# Patient Record
Sex: Male | Born: 1995 | Race: White | Hispanic: No | Marital: Married
Health system: Southern US, Community
[De-identification: ages and names within clinical notes are randomized; demographics above are authoritative.]

## PROBLEM LIST (undated history)

## (undated) ENCOUNTER — Emergency Department (HOSPITAL_COMMUNITY): Payer: Self-pay

---
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
## 2019-09-14 ENCOUNTER — Inpatient Hospital Stay (HOSPITAL_COMMUNITY): Payer: No Typology Code available for payment source

## 2019-09-14 ENCOUNTER — Encounter (HOSPITAL_COMMUNITY): Admission: EM | Disposition: A | Discharge status: Home Health | Payer: Self-pay | Source: Home / Self Care

## 2019-09-14 ENCOUNTER — Encounter (HOSPITAL_COMMUNITY): Payer: Self-pay | Admitting: Emergency Medicine

## 2019-09-14 ENCOUNTER — Emergency Department (HOSPITAL_COMMUNITY): Payer: No Typology Code available for payment source

## 2019-09-14 ENCOUNTER — Inpatient Hospital Stay (HOSPITAL_COMMUNITY)
Admission: EM | Admit: 2019-09-14 | Discharge: 2019-09-24 | DRG: 492 | Disposition: A | Discharge status: Home Health | Payer: No Typology Code available for payment source | Attending: Surgery | Admitting: Surgery

## 2019-09-14 ENCOUNTER — Other Ambulatory Visit: Payer: Self-pay

## 2019-09-14 ENCOUNTER — Inpatient Hospital Stay (HOSPITAL_COMMUNITY): Payer: No Typology Code available for payment source | Admitting: Certified Registered"

## 2019-09-14 DIAGNOSIS — Z539 Procedure and treatment not carried out, unspecified reason: Secondary | ICD-10-CM

## 2019-09-14 DIAGNOSIS — S60512A Abrasion of left hand, initial encounter: Secondary | ICD-10-CM | POA: Diagnosis present

## 2019-09-14 DIAGNOSIS — Z720 Tobacco use: Secondary | ICD-10-CM

## 2019-09-14 DIAGNOSIS — Z20828 Contact with and (suspected) exposure to other viral communicable diseases: Secondary | ICD-10-CM | POA: Diagnosis present

## 2019-09-14 DIAGNOSIS — S82842A Displaced bimalleolar fracture of left lower leg, initial encounter for closed fracture: Principal | ICD-10-CM

## 2019-09-14 DIAGNOSIS — T148XXA Other injury of unspecified body region, initial encounter: Secondary | ICD-10-CM

## 2019-09-14 DIAGNOSIS — Z9889 Other specified postprocedural states: Secondary | ICD-10-CM

## 2019-09-14 DIAGNOSIS — S80212A Abrasion, left knee, initial encounter: Secondary | ICD-10-CM | POA: Diagnosis present

## 2019-09-14 DIAGNOSIS — F1113 Opioid abuse with withdrawal: Secondary | ICD-10-CM | POA: Diagnosis not present

## 2019-09-14 DIAGNOSIS — S80211A Abrasion, right knee, initial encounter: Secondary | ICD-10-CM | POA: Diagnosis present

## 2019-09-14 DIAGNOSIS — M25572 Pain in left ankle and joints of left foot: Secondary | ICD-10-CM

## 2019-09-14 DIAGNOSIS — K0889 Other specified disorders of teeth and supporting structures: Secondary | ICD-10-CM | POA: Diagnosis present

## 2019-09-14 DIAGNOSIS — Y92410 Unspecified street and highway as the place of occurrence of the external cause: Secondary | ICD-10-CM | POA: Diagnosis not present

## 2019-09-14 DIAGNOSIS — S50312A Abrasion of left elbow, initial encounter: Secondary | ICD-10-CM | POA: Diagnosis present

## 2019-09-14 DIAGNOSIS — S32421A Displaced fracture of posterior wall of right acetabulum, initial encounter for closed fracture: Secondary | ICD-10-CM

## 2019-09-14 DIAGNOSIS — S32409A Unspecified fracture of unspecified acetabulum, initial encounter for closed fracture: Secondary | ICD-10-CM

## 2019-09-14 DIAGNOSIS — Z885 Allergy status to narcotic agent status: Secondary | ICD-10-CM

## 2019-09-14 DIAGNOSIS — Z419 Encounter for procedure for purposes other than remedying health state, unspecified: Secondary | ICD-10-CM

## 2019-09-14 DIAGNOSIS — S60511A Abrasion of right hand, initial encounter: Secondary | ICD-10-CM | POA: Diagnosis present

## 2019-09-14 DIAGNOSIS — D62 Acute posthemorrhagic anemia: Secondary | ICD-10-CM | POA: Diagnosis not present

## 2019-09-14 DIAGNOSIS — S73014A Posterior dislocation of right hip, initial encounter: Secondary | ICD-10-CM

## 2019-09-14 DIAGNOSIS — M79604 Pain in right leg: Secondary | ICD-10-CM | POA: Diagnosis present

## 2019-09-14 DIAGNOSIS — S2242XA Multiple fractures of ribs, left side, initial encounter for closed fracture: Secondary | ICD-10-CM

## 2019-09-14 DIAGNOSIS — S80812A Abrasion, left lower leg, initial encounter: Secondary | ICD-10-CM | POA: Diagnosis present

## 2019-09-14 DIAGNOSIS — S73004A Unspecified dislocation of right hip, initial encounter: Secondary | ICD-10-CM

## 2019-09-14 DIAGNOSIS — Z23 Encounter for immunization: Secondary | ICD-10-CM

## 2019-09-14 DIAGNOSIS — F1110 Opioid abuse, uncomplicated: Secondary | ICD-10-CM

## 2019-09-14 DIAGNOSIS — S32461A Displaced associated transverse-posterior fracture of right acetabulum, initial encounter for closed fracture: Secondary | ICD-10-CM | POA: Diagnosis present

## 2019-09-14 DIAGNOSIS — R52 Pain, unspecified: Secondary | ICD-10-CM

## 2019-09-14 HISTORY — PX: INSERTION OF TRACTION PIN: SHX6560

## 2019-09-14 HISTORY — PX: HIP CLOSED REDUCTION: SHX983

## 2019-09-14 LAB — COMPREHENSIVE METABOLIC PANEL
ALT: 60 U/L — ABNORMAL HIGH (ref 0–44)
AST: 87 U/L — ABNORMAL HIGH (ref 15–41)
Albumin: 3.4 g/dL — ABNORMAL LOW (ref 3.5–5.0)
Alkaline Phosphatase: 63 U/L (ref 38–126)
Anion gap: 13 (ref 5–15)
BUN: 13 mg/dL (ref 6–20)
BUN: 14 mg/dL (ref 6–20)
CO2: 22 mmol/L (ref 22–32)
Calcium: 9.1 mg/dL (ref 8.9–10.3)
Chloride: 103 mmol/L (ref 98–111)
Chloride: 102 mmol/L (ref 98–111)
Creatinine, Ser: 0.93 mg/dL (ref 0.61–1.24)
Creatinine, Ser: 0.8 mg/dL (ref 0.61–1.24)
GFR calc Af Amer: >60 mL/min (ref >60)
GFR calc non Af Amer: >60 mL/min (ref >60)
Glucose, Bld: 161 mg/dL — ABNORMAL HIGH (ref 70–99)
Glucose, Bld: 159 mg/dL — ABNORMAL HIGH (ref 70–99)
Potassium: 3.8 mmol/L (ref 3.5–5.1)
Potassium: 3.7 mmol/L (ref 3.5–5.1)
Sodium: 138 mmol/L (ref 135–145)
Sodium: 139 mmol/L (ref 135–145)
Total Bilirubin: 0.7 mg/dL (ref 0.3–1.2)
Total Protein: 7.4 g/dL (ref 6.5–8.1)

## 2019-09-14 LAB — ETHANOL: Alcohol, Ethyl (B): <10 mg/dL (ref <10)

## 2019-09-14 LAB — URINALYSIS, ROUTINE W REFLEX MICROSCOPIC
Bilirubin Urine: NEGATIVE
Glucose, UA: NEGATIVE mg/dL
Hgb urine dipstick: NEGATIVE
Ketones, ur: NEGATIVE mg/dL
Leukocytes,Ua: NEGATIVE
Nitrite: NEGATIVE
Protein, ur: NEGATIVE mg/dL
Specific Gravity, Urine: 1.026 (ref 1.005–1.030)
pH: 7 (ref 5.0–8.0)

## 2019-09-14 LAB — CBC
HCT: 41.7 % (ref 39.0–52.0)
Hemoglobin: 13.4 g/dL (ref 13.0–17.0)
MCH: 28 pg (ref 26.0–34.0)
MCHC: 32.1 g/dL (ref 30.0–36.0)
MCV: 87.1 fL (ref 80.0–100.0)
Platelets: 360 10*3/uL (ref 150–400)
RBC: 4.79 MIL/uL (ref 4.22–5.81)
RDW: 13.9 % (ref 11.5–15.5)
WBC: 18.7 10*3/uL — ABNORMAL HIGH (ref 4.0–10.5)
nRBC: 0 % (ref 0.0–0.2)

## 2019-09-14 LAB — SARS CORONAVIRUS 2 BY RT PCR (HOSPITAL ORDER, PERFORMED IN ~~LOC~~ HOSPITAL LAB): SARS Coronavirus 2: NEGATIVE

## 2019-09-14 LAB — RAPID URINE DRUG SCREEN, HOSP PERFORMED
Amphetamines: NOT DETECTED
Barbiturates: NOT DETECTED
Benzodiazepines: POSITIVE — AB
Cocaine: NOT DETECTED
Opiates: POSITIVE — AB
Tetrahydrocannabinol: NOT DETECTED

## 2019-09-14 LAB — CDS SEROLOGY

## 2019-09-14 LAB — PROTIME-INR
INR: 0.9 (ref 0.8–1.2)
Prothrombin Time: 12.4 seconds (ref 11.4–15.2)

## 2019-09-14 LAB — SAMPLE TO BLOOD BANK

## 2019-09-14 LAB — HIV ANTIBODY (ROUTINE TESTING W REFLEX): HIV Screen 4th Generation wRfx: NONREACTIVE

## 2019-09-14 LAB — LACTIC ACID, PLASMA: Lactic Acid, Venous: 2.3 mmol/L (ref 0.5–1.9)

## 2019-09-14 SURGERY — CLOSED REDUCTION, HIP
Anesthesia: General | Site: Leg Upper | Laterality: Right

## 2019-09-14 MED ORDER — METHOCARBAMOL 500 MG PO TABS
500.0000 mg | ORAL_TABLET | Freq: Four times a day (QID) | ORAL | Status: DC | PRN
  Administered 2019-09-14 – 2019-09-18 (×9): 500 mg via ORAL
  Filled 2019-09-14 (×9): qty 1

## 2019-09-14 MED ORDER — ACETAMINOPHEN 10 MG/ML IV SOLN
INTRAVENOUS | Status: AC
  Filled 2019-09-14: qty 100

## 2019-09-14 MED ORDER — ACETAMINOPHEN 325 MG PO TABS: 650.0000 mg | ORAL_TABLET | ORAL | Status: DC | PRN

## 2019-09-14 MED ORDER — LIDOCAINE 2% (20 MG/ML) 5 ML SYRINGE
INTRAMUSCULAR | Status: DC | PRN
  Administered 2019-09-14: 60 mg via INTRAVENOUS

## 2019-09-14 MED ORDER — CEFAZOLIN SODIUM-DEXTROSE 2-4 GM/100ML-% IV SOLN
2.0000 g | INTRAVENOUS | Status: AC
  Administered 2019-09-14: 13:00:00 2 g via INTRAVENOUS

## 2019-09-14 MED ORDER — POVIDONE-IODINE 10 % EX SWAB: 2.0000 "application " | Freq: Once | CUTANEOUS | Status: DC

## 2019-09-14 MED ORDER — ONDANSETRON 4 MG PO TBDP: 4.0000 mg | ORAL_TABLET | Freq: Four times a day (QID) | ORAL | Status: DC | PRN

## 2019-09-14 MED ORDER — SODIUM CHLORIDE 0.9 % IV BOLUS
20.0000 mL/kg | Freq: Once | INTRAVENOUS | Status: AC
  Administered 2019-09-14: 1000 mL via INTRAVENOUS

## 2019-09-14 MED ORDER — FENTANYL CITRATE (PF) 100 MCG/2ML IJ SOLN: 100.0000 ug | Freq: Once | INTRAMUSCULAR | Status: DC

## 2019-09-14 MED ORDER — IOHEXOL 300 MG/ML  SOLN
100.0000 mL | Freq: Once | INTRAMUSCULAR | Status: AC | PRN
  Administered 2019-09-14: 100 mL via INTRAVENOUS

## 2019-09-14 MED ORDER — DEXMEDETOMIDINE HCL 200 MCG/2ML IV SOLN
INTRAVENOUS | Status: DC | PRN
  Administered 2019-09-14: 20 ug via INTRAVENOUS
  Administered 2019-09-14: 8 ug via INTRAVENOUS

## 2019-09-14 MED ORDER — MEPERIDINE HCL 25 MG/ML IJ SOLN: 6.2500 mg | INTRAMUSCULAR | Status: DC | PRN

## 2019-09-14 MED ORDER — CEFAZOLIN SODIUM-DEXTROSE 2-4 GM/100ML-% IV SOLN
INTRAVENOUS | Status: AC
  Filled 2019-09-14: qty 100

## 2019-09-14 MED ORDER — CLONIDINE HCL 0.1 MG PO TABS
0.1000 mg | ORAL_TABLET | ORAL | Status: AC
  Administered 2019-09-17 – 2019-09-18 (×3): 0.1 mg via ORAL
  Filled 2019-09-14 (×3): qty 1

## 2019-09-14 MED ORDER — TETANUS-DIPHTH-ACELL PERTUSSIS 5-2.5-18.5 LF-MCG/0.5 IM SUSP
0.5000 mL | Freq: Once | INTRAMUSCULAR | Status: DC
  Filled 2019-09-14: qty 0.5

## 2019-09-14 MED ORDER — LOPERAMIDE HCL 2 MG PO CAPS: 2.0000 mg | ORAL_CAPSULE | ORAL | Status: DC | PRN

## 2019-09-14 MED ORDER — ONDANSETRON HCL 4 MG/2ML IJ SOLN
4.0000 mg | Freq: Once | INTRAMUSCULAR | Status: AC
  Administered 2019-09-14: 4 mg via INTRAVENOUS
  Filled 2019-09-14: qty 2

## 2019-09-14 MED ORDER — LORAZEPAM 2 MG/ML IJ SOLN
1.0000 mg | Freq: Once | INTRAMUSCULAR | Status: AC
  Administered 2019-09-14: 1 mg via INTRAVENOUS

## 2019-09-14 MED ORDER — CHLORHEXIDINE GLUCONATE 4 % EX LIQD: 60.0000 mL | Freq: Once | CUTANEOUS | Status: DC

## 2019-09-14 MED ORDER — SODIUM CHLORIDE 0.9 % IV SOLN
INTRAVENOUS | Status: DC
  Administered 2019-09-14 – 2019-09-21 (×14): via INTRAVENOUS

## 2019-09-14 MED ORDER — SODIUM CHLORIDE 0.9 % IV SOLN
Freq: Once | INTRAVENOUS | Status: AC
  Administered 2019-09-15: 03:00:00 via INTRAVENOUS

## 2019-09-14 MED ORDER — HYDROXYZINE HCL 25 MG PO TABS
25.0000 mg | ORAL_TABLET | Freq: Four times a day (QID) | ORAL | Status: AC | PRN
  Administered 2019-09-15 – 2019-09-18 (×5): 25 mg via ORAL
  Filled 2019-09-14 (×6): qty 1

## 2019-09-14 MED ORDER — HYDROMORPHONE HCL 1 MG/ML IJ SOLN
INTRAMUSCULAR | Status: AC
  Filled 2019-09-14: qty 0.5

## 2019-09-14 MED ORDER — METOPROLOL TARTRATE 5 MG/5ML IV SOLN: 5.0000 mg | Freq: Four times a day (QID) | INTRAVENOUS | Status: DC | PRN

## 2019-09-14 MED ORDER — HYDROMORPHONE HCL 1 MG/ML IJ SOLN
1.0000 mg | Freq: Once | INTRAMUSCULAR | Status: AC
  Administered 2019-09-14: 1 mg via INTRAVENOUS
  Filled 2019-09-14: qty 1

## 2019-09-14 MED ORDER — MIDAZOLAM HCL 2 MG/2ML IJ SOLN
INTRAMUSCULAR | Status: AC
  Filled 2019-09-14: qty 2

## 2019-09-14 MED ORDER — ENOXAPARIN SODIUM 40 MG/0.4ML ~~LOC~~ SOLN
40.0000 mg | SUBCUTANEOUS | Status: DC
  Administered 2019-09-15 – 2019-09-16 (×2): 40 mg via SUBCUTANEOUS
  Filled 2019-09-14 (×2): qty 0.4

## 2019-09-14 MED ORDER — OXYCODONE HCL 5 MG PO TABS: 5.0000 mg | ORAL_TABLET | ORAL | Status: DC | PRN

## 2019-09-14 MED ORDER — HALOPERIDOL LACTATE 5 MG/ML IJ SOLN
5.0000 mg | INTRAMUSCULAR | Status: DC | PRN
  Administered 2019-09-14 – 2019-09-18 (×8): 5 mg via INTRAVENOUS
  Filled 2019-09-14 (×9): qty 1

## 2019-09-14 MED ORDER — HYDROMORPHONE HCL 1 MG/ML IJ SOLN
1.0000 mg | Freq: Once | INTRAMUSCULAR | Status: AC
  Administered 2019-09-14: 1 mg via INTRAVENOUS

## 2019-09-14 MED ORDER — FENTANYL CITRATE (PF) 100 MCG/2ML IJ SOLN
100.0000 ug | Freq: Once | INTRAMUSCULAR | Status: AC
  Administered 2019-09-14: 100 ug via INTRAVENOUS
  Filled 2019-09-14: qty 2

## 2019-09-14 MED ORDER — ROCURONIUM BROMIDE 10 MG/ML (PF) SYRINGE
PREFILLED_SYRINGE | INTRAVENOUS | Status: DC | PRN
  Administered 2019-09-14: 20 mg via INTRAVENOUS

## 2019-09-14 MED ORDER — ACETAMINOPHEN 500 MG PO TABS
1000.0000 mg | ORAL_TABLET | Freq: Four times a day (QID) | ORAL | Status: DC
  Administered 2019-09-14 – 2019-09-24 (×35): 1000 mg via ORAL
  Filled 2019-09-14 (×36): qty 2

## 2019-09-14 MED ORDER — PHENYLEPHRINE 40 MCG/ML (10ML) SYRINGE FOR IV PUSH (FOR BLOOD PRESSURE SUPPORT)
PREFILLED_SYRINGE | INTRAVENOUS | Status: DC | PRN
  Administered 2019-09-14: 80 ug via INTRAVENOUS

## 2019-09-14 MED ORDER — NAPROXEN 250 MG PO TABS
500.0000 mg | ORAL_TABLET | Freq: Two times a day (BID) | ORAL | Status: DC | PRN
  Administered 2019-09-14: 500 mg via ORAL
  Filled 2019-09-14 (×2): qty 2

## 2019-09-14 MED ORDER — FENTANYL CITRATE (PF) 100 MCG/2ML IJ SOLN
INTRAMUSCULAR | Status: DC | PRN
  Administered 2019-09-14: 100 ug via INTRAVENOUS
  Administered 2019-09-14: 50 ug via INTRAVENOUS
  Administered 2019-09-14: 100 ug via INTRAVENOUS

## 2019-09-14 MED ORDER — FENTANYL CITRATE (PF) 250 MCG/5ML IJ SOLN
INTRAMUSCULAR | Status: AC
  Filled 2019-09-14: qty 5

## 2019-09-14 MED ORDER — LACTATED RINGERS IV SOLN
INTRAVENOUS | Status: DC | PRN
  Administered 2019-09-14: 13:00:00 via INTRAVENOUS

## 2019-09-14 MED ORDER — NICOTINE 21 MG/24HR TD PT24
21.0000 mg | MEDICATED_PATCH | Freq: Every day | TRANSDERMAL | Status: DC
  Administered 2019-09-15 – 2019-09-23 (×8): 21 mg via TRANSDERMAL
  Filled 2019-09-14 (×11): qty 1

## 2019-09-14 MED ORDER — HYDROMORPHONE HCL 1 MG/ML IJ SOLN
INTRAMUSCULAR | Status: DC | PRN
  Administered 2019-09-14: 0.5 mg via INTRAVENOUS

## 2019-09-14 MED ORDER — PANTOPRAZOLE SODIUM 40 MG IV SOLR
40.0000 mg | Freq: Every day | INTRAVENOUS | Status: DC
  Filled 2019-09-14: qty 40

## 2019-09-14 MED ORDER — SUGAMMADEX SODIUM 200 MG/2ML IV SOLN
INTRAVENOUS | Status: DC | PRN
  Administered 2019-09-14: 120 mg via INTRAVENOUS

## 2019-09-14 MED ORDER — LORAZEPAM 2 MG/ML IJ SOLN
INTRAMUSCULAR | Status: AC
  Filled 2019-09-14: qty 1

## 2019-09-14 MED ORDER — MIDAZOLAM HCL 5 MG/5ML IJ SOLN
INTRAMUSCULAR | Status: DC | PRN
  Administered 2019-09-14: 2 mg via INTRAVENOUS

## 2019-09-14 MED ORDER — ONDANSETRON HCL 4 MG/2ML IJ SOLN
INTRAMUSCULAR | Status: DC | PRN
  Administered 2019-09-14: 4 mg via INTRAVENOUS

## 2019-09-14 MED ORDER — HYDROCODONE-ACETAMINOPHEN 7.5-325 MG PO TABS: 1.0000 | ORAL_TABLET | Freq: Once | ORAL | Status: DC | PRN

## 2019-09-14 MED ORDER — HYDROMORPHONE HCL 1 MG/ML IJ SOLN
INTRAMUSCULAR | Status: AC
  Filled 2019-09-14: qty 1

## 2019-09-14 MED ORDER — GABAPENTIN 100 MG PO CAPS
100.0000 mg | ORAL_CAPSULE | Freq: Three times a day (TID) | ORAL | Status: DC
  Administered 2019-09-14 – 2019-09-21 (×20): 100 mg via ORAL
  Filled 2019-09-14 (×20): qty 1

## 2019-09-14 MED ORDER — MORPHINE SULFATE (PF) 2 MG/ML IV SOLN
1.0000 mg | INTRAVENOUS | Status: DC | PRN
  Administered 2019-09-14 (×3): 3 mg via INTRAVENOUS
  Administered 2019-09-14 – 2019-09-16 (×13): 2 mg via INTRAVENOUS
  Administered 2019-09-16: 3 mg via INTRAVENOUS
  Administered 2019-09-16: 2 mg via INTRAVENOUS
  Administered 2019-09-16: 3 mg via INTRAVENOUS
  Administered 2019-09-16: 23:00:00 2 mg via INTRAVENOUS
  Administered 2019-09-16 (×2): 3 mg via INTRAVENOUS
  Administered 2019-09-17 – 2019-09-18 (×4): 2 mg via INTRAVENOUS
  Administered 2019-09-18 (×2): 3 mg via INTRAVENOUS
  Filled 2019-09-14: qty 2
  Filled 2019-09-14: qty 1
  Filled 2019-09-14: qty 2
  Filled 2019-09-14 (×3): qty 1
  Filled 2019-09-14: qty 2
  Filled 2019-09-14 (×6): qty 1
  Filled 2019-09-14 (×2): qty 2
  Filled 2019-09-14: qty 1
  Filled 2019-09-14: qty 2
  Filled 2019-09-14: qty 1
  Filled 2019-09-14: qty 2
  Filled 2019-09-14 (×5): qty 1
  Filled 2019-09-14: qty 2
  Filled 2019-09-14: qty 1
  Filled 2019-09-14: qty 2
  Filled 2019-09-14 (×3): qty 1

## 2019-09-14 MED ORDER — PROPOFOL 10 MG/ML IV BOLUS
INTRAVENOUS | Status: DC | PRN
  Administered 2019-09-14: 140 mg via INTRAVENOUS

## 2019-09-14 MED ORDER — ONDANSETRON HCL 4 MG/2ML IJ SOLN
4.0000 mg | Freq: Four times a day (QID) | INTRAMUSCULAR | Status: DC | PRN
  Administered 2019-09-14: 4 mg via INTRAVENOUS
  Filled 2019-09-14: qty 2

## 2019-09-14 MED ORDER — DEXAMETHASONE SODIUM PHOSPHATE 10 MG/ML IJ SOLN
INTRAMUSCULAR | Status: DC | PRN
  Administered 2019-09-14: 10 mg via INTRAVENOUS

## 2019-09-14 MED ORDER — OXYCODONE HCL 5 MG PO TABS
5.0000 mg | ORAL_TABLET | ORAL | Status: DC | PRN
  Administered 2019-09-14 – 2019-09-16 (×6): 10 mg via ORAL
  Administered 2019-09-16: 5 mg via ORAL
  Administered 2019-09-16 – 2019-09-17 (×4): 10 mg via ORAL
  Filled 2019-09-14 (×3): qty 2
  Filled 2019-09-14: qty 1
  Filled 2019-09-14 (×8): qty 2

## 2019-09-14 MED ORDER — HYDROMORPHONE HCL 1 MG/ML IJ SOLN: 1.0000 mg | INTRAMUSCULAR | Status: DC | PRN

## 2019-09-14 MED ORDER — HYDROMORPHONE HCL 1 MG/ML IJ SOLN
0.2500 mg | INTRAMUSCULAR | Status: DC | PRN
  Administered 2019-09-14 (×2): 0.5 mg via INTRAVENOUS

## 2019-09-14 MED ORDER — CEFAZOLIN SODIUM-DEXTROSE 2-4 GM/100ML-% IV SOLN
2.0000 g | Freq: Three times a day (TID) | INTRAVENOUS | Status: AC
  Administered 2019-09-14 – 2019-09-15 (×3): 2 g via INTRAVENOUS
  Filled 2019-09-14 (×3): qty 100

## 2019-09-14 MED ORDER — CLONIDINE HCL 0.1 MG PO TABS
0.1000 mg | ORAL_TABLET | Freq: Every day | ORAL | Status: AC
  Administered 2019-09-20: 0.1 mg via ORAL
  Filled 2019-09-14: qty 1

## 2019-09-14 MED ORDER — DOCUSATE SODIUM 100 MG PO CAPS
100.0000 mg | ORAL_CAPSULE | Freq: Two times a day (BID) | ORAL | Status: DC
  Administered 2019-09-14 – 2019-09-24 (×17): 100 mg via ORAL
  Filled 2019-09-14 (×19): qty 1

## 2019-09-14 MED ORDER — ACETAMINOPHEN 10 MG/ML IV SOLN
1000.0000 mg | Freq: Once | INTRAVENOUS | Status: DC | PRN
  Administered 2019-09-14: 1000 mg via INTRAVENOUS

## 2019-09-14 MED ORDER — TETANUS-DIPHTH-ACELL PERTUSSIS 5-2.5-18.5 LF-MCG/0.5 IM SUSP
0.5000 mL | Freq: Once | INTRAMUSCULAR | Status: AC
  Administered 2019-09-14: 0.5 mL via INTRAMUSCULAR

## 2019-09-14 MED ORDER — DICYCLOMINE HCL 20 MG PO TABS
20.0000 mg | ORAL_TABLET | Freq: Four times a day (QID) | ORAL | Status: AC | PRN
  Filled 2019-09-14: qty 1

## 2019-09-14 MED ORDER — PANTOPRAZOLE SODIUM 40 MG PO TBEC
40.0000 mg | DELAYED_RELEASE_TABLET | Freq: Every day | ORAL | Status: DC
  Administered 2019-09-14 – 2019-09-16 (×3): 40 mg via ORAL
  Filled 2019-09-14 (×3): qty 1

## 2019-09-14 MED ORDER — CLONIDINE HCL 0.1 MG PO TABS
0.1000 mg | ORAL_TABLET | Freq: Four times a day (QID) | ORAL | Status: AC
  Administered 2019-09-14 – 2019-09-16 (×9): 0.1 mg via ORAL
  Filled 2019-09-14 (×9): qty 1

## 2019-09-14 MED ORDER — PROMETHAZINE HCL 25 MG/ML IJ SOLN: 6.2500 mg | INTRAMUSCULAR | Status: DC | PRN

## 2019-09-14 MED ORDER — SUCCINYLCHOLINE CHLORIDE 200 MG/10ML IV SOSY
PREFILLED_SYRINGE | INTRAVENOUS | Status: DC | PRN
  Administered 2019-09-14: 100 mg via INTRAVENOUS

## 2019-09-14 SURGICAL SUPPLY — 1 items: BNDG GAUZE ELAST 4 BULKY (GAUZE/BANDAGES/DRESSINGS) ×2 IMPLANT
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
## 2019-09-14 NOTE — Progress Notes (Signed)
Pt refusing to allow ortho tech to place the traction

## 2019-09-14 NOTE — ED Notes (Signed)
Patient's fiance called requesting to speak with patient, phone given to patient at this time.

## 2019-09-14 NOTE — Anesthesia Procedure Notes (Signed)
Procedure Name: Intubation Date/Time: 09/14/2019 12:46 PM Performed by: Cleda Daub, CRNA Pre-anesthesia Checklist: Patient identified, Emergency Drugs available, Suction available and Patient being monitored Patient Re-evaluated:Patient Re-evaluated prior to induction Oxygen Delivery Method: Circle system utilized Preoxygenation: Pre-oxygenation with 100% oxygen Induction Type: IV induction, Rapid sequence and Cricoid Pressure applied Ventilation: Mask ventilation without difficulty Laryngoscope Size: Miller and 2 Grade View: Grade I Tube type: Oral Tube size: 7.5 mm Number of attempts: 1 Airway Equipment and Method: Stylet and Oral airway Placement Confirmation: ETT inserted through vocal cords under direct vision,  positive ETCO2 and breath sounds checked- equal and bilateral Secured at: 21 cm Tube secured with: Tape Dental Injury: Teeth and Oropharynx as per pre-operative assessment

## 2019-09-14 NOTE — ED Notes (Signed)
Patient transported to [place] by Nira Conn, RN

## 2019-09-14 NOTE — ED Notes (Signed)
Radiology at bedside

## 2019-09-14 NOTE — Progress Notes (Signed)
Critical Lab: Lactic Acid 2.3. sent text page to on call trauma MD

## 2019-09-14 NOTE — Anesthesia Preprocedure Evaluation (Addendum)
Anesthesia Evaluation  Patient identified by MRN, date of birth, ID band Patient confused    Reviewed: Allergy & Precautions, NPO status , Patient's Chart, lab work & pertinent test results  Airway Mallampati: II  TM Distance: >3 FB Neck ROM: Full    Dental no notable dental hx. (+) Teeth Intact, Dental Advisory Given   Pulmonary neg pulmonary ROS,    Pulmonary exam normal breath sounds clear to auscultation       Cardiovascular Exercise Tolerance: Good negative cardio ROS Normal cardiovascular exam Rhythm:Regular Rate:Normal     Neuro/Psych negative neurological ROS  negative psych ROS   GI/Hepatic negative GI ROS, (+)     substance abuse  ,   Endo/Other  negative endocrine ROS  Renal/GU negative Renal ROS     Musculoskeletal negative musculoskeletal ROS (+)   Abdominal   Peds  Hematology negative hematology ROS (+)   Anesthesia Other Findings   Reproductive/Obstetrics                             Anesthesia Physical Anesthesia Plan  ASA: II and emergent  Anesthesia Plan: General   Post-op Pain Management:    Induction: Intravenous  PONV Risk Score and Plan: 3 and Treatment may vary due to age or medical condition, Ondansetron and Dexamethasone  Airway Management Planned: Oral ETT  Additional Equipment:   Intra-op Plan:   Post-operative Plan: Extubation in OR  Informed Consent: I have reviewed the patients History and Physical, chart, labs and discussed the procedure including the risks, benefits and alternatives for the proposed anesthesia with the patient or authorized representative who has indicated his/her understanding and acceptance.     Dental advisory given  Plan Discussed with:   Anesthesia Plan Comments:         Anesthesia Quick Evaluation
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
## 2019-09-14 NOTE — Progress Notes (Signed)
Orthopedic Tech Progress Note Patient Details:  Tom Gonzalez Oct 22, 1996 [id number]  Short leg splint applied and traction on right leg removed because patient is being transported to CT. Nurse Caitlyn will call ortho when the patient is back so that traction can be put back on.  Ortho Devices Type of Ortho Device: Ace wrap, Short leg splint Ortho Device/Splint Location: LLE Ortho Device/Splint Interventions: Ordered, Application   Post Interventions Patient Tolerated: Well Instructions Provided: Care of device   Staci Righter 09/14/2019, 5:36 PM

## 2019-09-14 NOTE — Progress Notes (Signed)
Orthopedic Tech Progress Note Patient Details:  Tom Gonzalez [birth year] [id number]  Musculoskeletal Traction Type of Traction: Skeletal (Balanced Suspension) Traction Location: RLE Traction Weight: 20 lbs   Post Interventions Patient Tolerated: Well Instructions Provided: Care of device   Staci Righter 09/14/2019, 2:39 PM

## 2019-09-14 NOTE — ED Notes (Addendum)
Michael trauma PA consulted at [phone].
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
## 2019-09-14 NOTE — ED Notes (Signed)
Silvestre Gunner, PA with trauma services at bedside
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
## 2019-09-14 NOTE — Progress Notes (Signed)
Orthopedic Tech Progress Note Patient Details:  Tom Gonzalez 1996-10-15 [id number]   TRAUMA LEVEL 1 CHANGED TO LEVEL 2 Pt. Complained of right hip pain, doctor Michela Pitcher Someone will call after ct scans come back. Patient ID: Tom Gonzalez, male   DOB: 11/20/1995, 23 y.o.   MRN: [id number]   Tom Gonzalez 09/14/2019, 9:31 AM
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
## 2019-09-14 NOTE — Transfer of Care (Signed)
Immediate Anesthesia Transfer of Care Note  Patient: Tom Gonzalez  Procedure(s) Performed: CLOSED REDUCTION HIP (Right Hip) INSERTION OF TRACTION PIN (Right Leg Upper)  Patient Location: PACU  Anesthesia Type:General  Level of Consciousness: drowsy  Airway & Oxygen Therapy: Patient Spontanous Breathing and Patient connected to face mask oxygen  Post-op Assessment: Report given to RN and Post -op Vital signs reviewed and stable  Post vital signs: Reviewed and stable  Last Vitals:  Vitals Value Taken Time  BP 100/64 09/14/19 1317  Temp    Pulse 90 09/14/19 1321  Resp 28 09/14/19 1321  SpO2 100 % 09/14/19 1321  Vitals shown include unvalidated device data.  Last Pain:  Vitals:   09/14/19 1118  TempSrc:   PainSc: 10-Worst pain ever         Complications: No apparent anesthesia complications
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
## 2019-09-14 NOTE — ED Notes (Signed)
Patient requesting pain medication, patient advised that this RN will let the provider know but that he last had pain meds at 1011 and we have to be careful not to give him too much pain medication at once. PA Maczis made aware. Pt continues to yell out requesting pain medications.

## 2019-09-15 ENCOUNTER — Encounter (HOSPITAL_COMMUNITY): Payer: Self-pay

## 2019-09-15 LAB — CBC
HCT: 33.5 % — ABNORMAL LOW (ref 39.0–52.0)
Hemoglobin: 11.2 g/dL — ABNORMAL LOW (ref 13.0–17.0)
MCH: 28.4 pg (ref 26.0–34.0)
MCHC: 33.4 g/dL (ref 30.0–36.0)
MCV: 85 fL (ref 80.0–100.0)
Platelets: 263 10*3/uL (ref 150–400)
RBC: 3.94 MIL/uL — ABNORMAL LOW (ref 4.22–5.81)
RDW: 14.5 % (ref 11.5–15.5)
WBC: 15.8 10*3/uL — ABNORMAL HIGH (ref 4.0–10.5)
nRBC: 0 % (ref 0.0–0.2)

## 2019-09-15 LAB — BASIC METABOLIC PANEL
Anion gap: 8 (ref 5–15)
BUN: 7 mg/dL (ref 6–20)
CO2: 23 mmol/L (ref 22–32)
Calcium: 9 mg/dL (ref 8.9–10.3)
Chloride: 109 mmol/L (ref 98–111)
Creatinine, Ser: 0.89 mg/dL (ref 0.61–1.24)
GFR calc Af Amer: >60 mL/min (ref >60)
GFR calc non Af Amer: >60 mL/min (ref >60)
Glucose, Bld: 147 mg/dL — ABNORMAL HIGH (ref 70–99)
Potassium: 4.3 mmol/L (ref 3.5–5.1)
Sodium: 140 mmol/L (ref 135–145)

## 2019-09-15 LAB — CDS SEROLOGY

## 2019-09-15 LAB — VITAMIN D 25 HYDROXY (VIT D DEFICIENCY, FRACTURES): Vit D, 25-Hydroxy: 28.32 ng/mL — ABNORMAL LOW (ref 30–100)

## 2019-09-15 NOTE — Plan of Care (Signed)
  Problem: Education: Goal: Required Educational Video(s) Outcome: Progressing   Problem: Education: Goal: Knowledge of General Education information will improve Description: Including pain rating scale, medication(s)/side effects and non-pharmacologic comfort measures Outcome: Progressing   Problem: Activity: Goal: Risk for activity intolerance will decrease Outcome: Progressing   Problem: Pain Managment: Goal: General experience of comfort will improve Outcome: Progressing   Problem: Safety: Goal: Ability to remain free from injury will improve Outcome: Progressing

## 2019-09-15 NOTE — Progress Notes (Signed)
To this point iv had to be replaced- and cardiac monitor. With the adage of the haldol he has been less restless and increased cooperative- has not talked anymore about leaving ama

## 2019-09-15 NOTE — Progress Notes (Signed)
Orthopedic Tech Progress Note Patient Details:  Tom Gonzalez 10-10-1996 [id number] Applied traction and put 2 pads on patient leg here the traction bar rest on leg was leaving a print on leg so I added a cushion with the permission from the PA. Musculoskeletal Traction Type of Traction: Skeletal (Balanced Suspension) Traction Location: LRE Traction Weight: 20 lbs   Post Interventions Patient Tolerated: Well Instructions Provided: Care of device, Adjustment of device   [name] 09/15/2019, 9:10 AM
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
## 2019-09-15 NOTE — Progress Notes (Signed)
    Subjective: Skeletal traction in the process of being reapplied on arrival.  Patient reports pain as severe then with much better control after traction reapplied.  Tolerating diet.  No CP, SOB.   Denies LLE pain.  Objective:   VITALS:   Vitals:   09/14/19 1936 09/15/19 0100 09/15/19 0352 09/15/19 0731  BP: 114/87  103/88 125/81  Pulse: 87 85 98 97  Resp: 17  18 18   Temp: 98 F (36.7 C)  98 F (36.7 C) 98.8 F (37.1 C)  TempSrc: Oral  Axillary Oral  SpO2: 100%  97% 99%   CBC Latest Ref Rng & Units 09/15/2019 09/14/2019  WBC 4.0 - 10.5 K/uL 15.8(H) 18.7(H)  Hemoglobin 13.0 - 17.0 g/dL 11.2(L) 13.4  Hematocrit 39.0 - 52.0 % 33.5(L) 41.7  Platelets 150 - 400 K/uL 263 360   BMP Latest Ref Rng & Units 09/15/2019 09/14/2019 09/14/2019  Glucose 70 - 99 mg/dL 147(H) 159(H) 161(H)  BUN 6 - 20 mg/dL 7 14 13   Creatinine 0.61 - 1.24 mg/dL 0.89 0.80 0.93  Sodium 135 - 145 mmol/L 140 139 138  Potassium 3.5 - 5.1 mmol/L 4.3 3.7 3.8  Chloride 98 - 111 mmol/L 109 102 103  CO2 22 - 32 mmol/L 23 - 22  Calcium 8.9 - 10.3 mg/dL 9.0 - 9.1   Intake/Output      11/20 0701 - 11/21 0700 11/21 0701 - 11/22 0700   P.O.  240   I.V. 1800    Total Intake 1800 240   Urine 1150 800   Blood 0    Total Output 1150 800   Net +650 -560           Physical Exam: General: NAD.  Resting comfortably supine with right leg in skeletal traction MSK RLE: Skeletal traction in place Pin sites benign Anterior knee abrasion without active bleeding Foot warm.  Sensation intact.  Dorsiflexion/plantarflexion, EHL/FHL intact.  LLE:  Posterior splint and Ace wrap in place.  Toes warm.  Wiggles toes   Assessment / Plan: 1 Day Post-Op  S/P Procedure(s) (LRB): CLOSED REDUCTION HIP (Right) INSERTION OF TRACTION PIN (Right) by Dr. Doreatha Martin on 09/14/2019  Active Problems:   Right acetabular fracture ([place])   Right transverse-posterior wall acetabular fracture dislocation, status post closed reduction  right hip and distal femoral traction pin placement Bedrest Skeletal traction Definitive surgery planning for Monday   Left ankle bimalleolar fracture Splint Nonweightbearing LLE Elevate Definitive fixation planned for Monday   Okay for Lovenox but should be held for procedure Monday   Prudencio Burly III, PA-C 09/15/2019, 9:14 AM
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
## 2019-09-16 LAB — SURGICAL PCR SCREEN
MRSA, PCR: NEGATIVE
Staphylococcus aureus: NEGATIVE

## 2019-09-16 MED ORDER — LORAZEPAM 2 MG/ML IJ SOLN
1.0000 mg | INTRAMUSCULAR | Status: DC | PRN
  Administered 2019-09-16 – 2019-09-18 (×5): 1 mg via INTRAVENOUS
  Filled 2019-09-16 (×5): qty 1

## 2019-09-16 MED ORDER — CHLORHEXIDINE GLUCONATE CLOTH 2 % EX PADS
6.0000 | MEDICATED_PAD | Freq: Every day | CUTANEOUS | Status: DC
  Administered 2019-09-16 – 2019-09-23 (×5): 6 via TOPICAL

## 2019-09-16 NOTE — Progress Notes (Signed)
CSW attempted to acknowledge consult for substance use however patient was experiencing withdrawal. Assessment was not appropriate.

## 2019-09-16 NOTE — Progress Notes (Signed)
Updated patient's father, Kasean Denherder, on patient's condition. Informed of transfer to ICU and answered all questions. Father can be reached at [phone].  Candy Sledge, RN
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
## 2019-09-16 NOTE — Progress Notes (Signed)
    Subjective: Still in significant pain this A.M.  Adjusted traction / position in bed.   Tolerating diet.   Objective:   VITALS:   Vitals:   09/15/19 1949 09/15/19 2020 09/16/19 0355 09/16/19 0753  BP: 124/74  127/76 112/79  Pulse: (!) 103 98 (!) 109 98  Resp: 18  20 18   Temp: 99.2 F (37.3 C)  98.4 F (36.9 C) 99.7 F (37.6 C)  TempSrc: Oral  Oral Oral  SpO2: 99%  99% 100%   CBC Latest Ref Rng & Units 09/15/2019 09/14/2019  WBC 4.0 - 10.5 K/uL 15.8(H) 18.7(H)  Hemoglobin 13.0 - 17.0 g/dL 11.2(L) 13.4  Hematocrit 39.0 - 52.0 % 33.5(L) 41.7  Platelets 150 - 400 K/uL 263 360   BMP Latest Ref Rng & Units 09/15/2019 09/14/2019 09/14/2019  Glucose 70 - 99 mg/dL 147(H) 159(H) 161(H)  BUN 6 - 20 mg/dL 7 14 13   Creatinine 0.61 - 1.24 mg/dL 0.89 0.80 0.93  Sodium 135 - 145 mmol/L 140 139 138  Potassium 3.5 - 5.1 mmol/L 4.3 3.7 3.8  Chloride 98 - 111 mmol/L 109 102 103  CO2 22 - 32 mmol/L 23 - 22  Calcium 8.9 - 10.3 mg/dL 9.0 - 9.1   Intake/Output      11/21 0701 - 11/22 0700 11/22 0701 - 11/23 0700   P.O. 720    I.V.     Total Intake 720    Urine 3250    Blood     Total Output 3250    Net -2530            Physical Exam: General: NAD.  Supine in bed with right leg in skeletal traction MSK RLE: Skeletal traction in place Pin sites benign Anterior knee abrasion without active bleeding.  Abd in place. Foot warm.  Sensation intact.  Dorsiflexion/plantarflexion, EHL/FHL intact.  LLE:  Posterior splint and Ace wrap in place.  Toes warm.  Wiggles toes   Assessment / Plan: 2 Days Post-Op  S/P Procedure(s) (LRB): CLOSED REDUCTION HIP (Right) INSERTION OF TRACTION PIN (Right) by Dr. Doreatha Martin on 09/14/2019  Active Problems:   Right acetabular fracture ([place])   Right transverse-posterior wall acetabular fracture dislocation, status post closed reduction right hip and distal femoral traction pin placement Bedrest Skeletal traction Definitive surgery planning for  Monday   Left ankle bimalleolar fracture Splint Nonweightbearing LLE Elevate Definitive fixation planned for Monday   Okay for Lovenox but should be held for procedure Monday   Prudencio Burly III, PA-C 09/16/2019, 8:16 AM

## 2019-09-16 NOTE — Progress Notes (Signed)
Called by RN on floor- pt had pulled his IV out, acting erractically, heart rate as high as 170.  Onarrival to room- pt is diaphoretic, writhing in the bed, is alert, but gritting teeth--  IV restarted with a 22G in right hand-- Ativan given, Dr. Rosendo Gros at bedside. Will transfer pt to 4N ICU.  Marissa, RN chg on 4N ICU notified.

## 2019-09-16 NOTE — Progress Notes (Signed)
Patient to 4N30 at 1537, a/ox4 on arrival with complete assessment as charted. No personal belongings arrived with patient. Will call to update patient's father on his location.  Candy Sledge, RN

## 2019-09-16 NOTE — Progress Notes (Signed)
On  Notified of increase in opiate score- to 15- HR 125-170's broke out in sweat- visibly shaking-he is requesting more pain medication- last had morphine at 0246- administered 10mg  oxy- along with atarax and robaxan- no new orders-"just will have to ride it out"

## 2019-09-16 NOTE — Progress Notes (Signed)
RN spoke with CN and PA about pt elevated HR, increased MEWs and withdrawals; pain meds given, oxygen and suction set up at bedside. Will continue to monitor for changes and provide pain meds as they become due. Tekoa Amon Elon Spanner, RN 09/16/2019 10:53 AM

## 2019-09-16 NOTE — Progress Notes (Signed)
Called by Trauma RN to BS Pt con't to be erratic and appears to be undergoing withdrawal from heroin. Pt tachy Will transfer pt to ICU to more acute care.

## 2019-09-16 NOTE — Plan of Care (Signed)
  Problem: Education: Goal: Knowledge of General Education information will improve Description: Including pain rating scale, medication(s)/side effects and non-pharmacologic comfort measures Outcome: Progressing   Problem: Nutrition: Goal: Adequate nutrition will be maintained Outcome: Progressing   Problem: Coping: Goal: Level of anxiety will decrease Outcome: Progressing   Problem: Pain Managment: Goal: General experience of comfort will improve Outcome: Not Progressing
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
## 2019-09-16 NOTE — Progress Notes (Signed)
This CN spoke with both the PA and MD regarding pt's withdrawals and HR.; No new orders. Will continue to monitor pt. Pt given pain meds per MD stated to give more in an hr from time last pain med was given Pt's nurse made aware.

## 2019-09-17 ENCOUNTER — Encounter (HOSPITAL_COMMUNITY): Admission: EM | Disposition: A | Discharge status: Home Health | Payer: Self-pay | Source: Home / Self Care

## 2019-09-17 ENCOUNTER — Inpatient Hospital Stay (HOSPITAL_COMMUNITY): Payer: No Typology Code available for payment source

## 2019-09-17 ENCOUNTER — Inpatient Hospital Stay (HOSPITAL_COMMUNITY): Payer: No Typology Code available for payment source | Admitting: Anesthesiology

## 2019-09-17 ENCOUNTER — Encounter (HOSPITAL_COMMUNITY): Payer: Self-pay | Admitting: Student

## 2019-09-17 HISTORY — PX: OPEN REDUCTION INTERNAL FIXATION ACETABULUM FRACTURE POSTERIOR: SHX6833

## 2019-09-17 SURGERY — OPEN REDUCTION INTERNAL FIXATION ACETABULUM FRACTURE POSTERIOR
Anesthesia: General | Laterality: Right

## 2019-09-17 MED ORDER — ROCURONIUM BROMIDE 10 MG/ML (PF) SYRINGE
PREFILLED_SYRINGE | INTRAVENOUS | Status: AC
  Filled 2019-09-17: qty 10

## 2019-09-17 MED ORDER — PHENYLEPHRINE 40 MCG/ML (10ML) SYRINGE FOR IV PUSH (FOR BLOOD PRESSURE SUPPORT)
PREFILLED_SYRINGE | INTRAVENOUS | Status: DC | PRN
  Administered 2019-09-17 (×2): 80 ug via INTRAVENOUS
  Administered 2019-09-17: 120 ug via INTRAVENOUS
  Administered 2019-09-17: 80 ug via INTRAVENOUS

## 2019-09-17 MED ORDER — HYDROMORPHONE HCL 1 MG/ML IJ SOLN
INTRAMUSCULAR | Status: DC | PRN
  Administered 2019-09-17 (×2): 0.5 mg via INTRAVENOUS

## 2019-09-17 MED ORDER — HYDROMORPHONE HCL 1 MG/ML IJ SOLN
INTRAMUSCULAR | Status: AC
  Filled 2019-09-17: qty 0.5

## 2019-09-17 MED ORDER — ENOXAPARIN SODIUM 40 MG/0.4ML ~~LOC~~ SOLN
40.0000 mg | SUBCUTANEOUS | Status: DC
  Administered 2019-09-18 – 2019-09-23 (×5): 40 mg via SUBCUTANEOUS
  Filled 2019-09-17 (×6): qty 0.4

## 2019-09-17 MED ORDER — MIDAZOLAM HCL 2 MG/2ML IJ SOLN
INTRAMUSCULAR | Status: DC | PRN
  Administered 2019-09-17: 2 mg via INTRAVENOUS

## 2019-09-17 MED ORDER — EPHEDRINE SULFATE-NACL 50-0.9 MG/10ML-% IV SOSY
PREFILLED_SYRINGE | INTRAVENOUS | Status: DC | PRN
  Administered 2019-09-17: 5 mg via INTRAVENOUS
  Administered 2019-09-17: 10 mg via INTRAVENOUS

## 2019-09-17 MED ORDER — LIDOCAINE 2% (20 MG/ML) 5 ML SYRINGE
INTRAMUSCULAR | Status: DC | PRN
  Administered 2019-09-17: 50 mg via INTRAVENOUS

## 2019-09-17 MED ORDER — ONDANSETRON HCL 4 MG/2ML IJ SOLN
INTRAMUSCULAR | Status: AC
  Filled 2019-09-17: qty 2

## 2019-09-17 MED ORDER — FENTANYL CITRATE (PF) 100 MCG/2ML IJ SOLN
INTRAMUSCULAR | Status: AC
  Filled 2019-09-17: qty 2

## 2019-09-17 MED ORDER — SODIUM CHLORIDE 0.9 % IR SOLN
Status: DC | PRN
  Administered 2019-09-17: 3000 mL

## 2019-09-17 MED ORDER — TOBRAMYCIN SULFATE 1.2 G IJ SOLR
INTRAMUSCULAR | Status: DC | PRN
  Administered 2019-09-17: 1.2 g via TOPICAL

## 2019-09-17 MED ORDER — VANCOMYCIN HCL 1000 MG IV SOLR
INTRAVENOUS | Status: AC
  Filled 2019-09-17: qty 2000

## 2019-09-17 MED ORDER — ROCURONIUM BROMIDE 10 MG/ML (PF) SYRINGE
PREFILLED_SYRINGE | INTRAVENOUS | Status: DC | PRN
  Administered 2019-09-17 (×2): 50 mg via INTRAVENOUS
  Administered 2019-09-17: 80 mg via INTRAVENOUS
  Administered 2019-09-17: 20 mg via INTRAVENOUS

## 2019-09-17 MED ORDER — ONDANSETRON HCL 4 MG/2ML IJ SOLN
INTRAMUSCULAR | Status: DC | PRN
  Administered 2019-09-17: 4 mg via INTRAVENOUS

## 2019-09-17 MED ORDER — PHENYLEPHRINE 40 MCG/ML (10ML) SYRINGE FOR IV PUSH (FOR BLOOD PRESSURE SUPPORT)
PREFILLED_SYRINGE | INTRAVENOUS | Status: AC
  Filled 2019-09-17: qty 10

## 2019-09-17 MED ORDER — SUGAMMADEX SODIUM 200 MG/2ML IV SOLN
INTRAVENOUS | Status: DC | PRN
  Administered 2019-09-17: 200 mg via INTRAVENOUS

## 2019-09-17 MED ORDER — FENTANYL CITRATE (PF) 250 MCG/5ML IJ SOLN
INTRAMUSCULAR | Status: AC
  Filled 2019-09-17: qty 5

## 2019-09-17 MED ORDER — CEFAZOLIN SODIUM-DEXTROSE 2-4 GM/100ML-% IV SOLN
2.0000 g | Freq: Three times a day (TID) | INTRAVENOUS | Status: AC
  Administered 2019-09-17 – 2019-09-18 (×3): 2 g via INTRAVENOUS
  Filled 2019-09-17 (×3): qty 100

## 2019-09-17 MED ORDER — 0.9 % SODIUM CHLORIDE (POUR BTL) OPTIME
TOPICAL | Status: DC | PRN
  Administered 2019-09-17: 1000 mL

## 2019-09-17 MED ORDER — PHENYLEPHRINE HCL-NACL 10-0.9 MG/250ML-% IV SOLN
INTRAVENOUS | Status: DC | PRN
  Administered 2019-09-17: 50 ug/min via INTRAVENOUS

## 2019-09-17 MED ORDER — MIDAZOLAM HCL 2 MG/2ML IJ SOLN
INTRAMUSCULAR | Status: AC
  Filled 2019-09-17: qty 2

## 2019-09-17 MED ORDER — ALBUMIN HUMAN 5 % IV SOLN
INTRAVENOUS | Status: DC | PRN
  Administered 2019-09-17 (×2): via INTRAVENOUS

## 2019-09-17 MED ORDER — PROPOFOL 10 MG/ML IV BOLUS
INTRAVENOUS | Status: AC
  Filled 2019-09-17: qty 20

## 2019-09-17 MED ORDER — KETAMINE HCL 50 MG/5ML IJ SOSY
PREFILLED_SYRINGE | INTRAMUSCULAR | Status: AC
  Filled 2019-09-17: qty 10

## 2019-09-17 MED ORDER — KETAMINE HCL 10 MG/ML IJ SOLN
INTRAMUSCULAR | Status: DC | PRN
  Administered 2019-09-17: 30 mg via INTRAVENOUS
  Administered 2019-09-17: 10 mg via INTRAVENOUS
  Administered 2019-09-17 (×2): 20 mg via INTRAVENOUS
  Administered 2019-09-17 (×2): 10 mg via INTRAVENOUS

## 2019-09-17 MED ORDER — TOBRAMYCIN SULFATE 1.2 G IJ SOLR
INTRAMUSCULAR | Status: AC
  Filled 2019-09-17: qty 2.4

## 2019-09-17 MED ORDER — FENTANYL CITRATE (PF) 100 MCG/2ML IJ SOLN
25.0000 ug | INTRAMUSCULAR | Status: DC | PRN
  Administered 2019-09-17: 50 ug via INTRAVENOUS
  Administered 2019-09-17 (×2): 25 ug via INTRAVENOUS

## 2019-09-17 MED ORDER — EPHEDRINE 5 MG/ML INJ
INTRAVENOUS | Status: AC
  Filled 2019-09-17: qty 10

## 2019-09-17 MED ORDER — CEFAZOLIN SODIUM-DEXTROSE 2-4 GM/100ML-% IV SOLN
INTRAVENOUS | Status: AC
  Filled 2019-09-17: qty 100

## 2019-09-17 MED ORDER — FENTANYL CITRATE (PF) 250 MCG/5ML IJ SOLN
INTRAMUSCULAR | Status: DC | PRN
  Administered 2019-09-17 (×2): 50 ug via INTRAVENOUS
  Administered 2019-09-17: 100 ug via INTRAVENOUS
  Administered 2019-09-17: 150 ug via INTRAVENOUS
  Administered 2019-09-17: 50 ug via INTRAVENOUS
  Administered 2019-09-17: 100 ug via INTRAVENOUS

## 2019-09-17 MED ORDER — LACTATED RINGERS IV SOLN
INTRAVENOUS | Status: DC | PRN
  Administered 2019-09-17 (×4): via INTRAVENOUS

## 2019-09-17 MED ORDER — DEXAMETHASONE SODIUM PHOSPHATE 10 MG/ML IJ SOLN
INTRAMUSCULAR | Status: DC | PRN
  Administered 2019-09-17: 10 mg via INTRAVENOUS

## 2019-09-17 MED ORDER — PROPOFOL 10 MG/ML IV BOLUS
INTRAVENOUS | Status: DC | PRN
  Administered 2019-09-17: 150 mg via INTRAVENOUS

## 2019-09-17 MED ORDER — DEXAMETHASONE SODIUM PHOSPHATE 10 MG/ML IJ SOLN
INTRAMUSCULAR | Status: AC
  Filled 2019-09-17: qty 1

## 2019-09-17 MED ORDER — LIDOCAINE 2% (20 MG/ML) 5 ML SYRINGE
INTRAMUSCULAR | Status: AC
  Filled 2019-09-17: qty 5

## 2019-09-17 MED ORDER — VANCOMYCIN HCL 1 G IV SOLR
INTRAVENOUS | Status: DC | PRN
  Administered 2019-09-17: 1000 mg via TOPICAL

## 2019-09-17 MED ORDER — DEXMEDETOMIDINE HCL 200 MCG/2ML IV SOLN
INTRAVENOUS | Status: DC | PRN
  Administered 2019-09-17 (×2): 8 ug via INTRAVENOUS
  Administered 2019-09-17: 4 ug via INTRAVENOUS
  Administered 2019-09-17: 12 ug via INTRAVENOUS
  Administered 2019-09-17: 8 ug via INTRAVENOUS

## 2019-09-17 MED ORDER — CEFAZOLIN SODIUM-DEXTROSE 2-3 GM-%(50ML) IV SOLR
INTRAVENOUS | Status: DC | PRN
  Administered 2019-09-17 (×2): 2 g via INTRAVENOUS

## 2019-09-17 SURGICAL SUPPLY — 85 items
APL PRP STRL LF DISP 70% ISPRP (MISCELLANEOUS) ×2
APPLIER CLIP 9.375 MED OPEN (MISCELLANEOUS) ×3
APR CLP MED 9.3 20 MLT OPN (MISCELLANEOUS) ×1
BIT DRILL CANN 4.5MM (BIT) IMPLANT
BIT DRILL CANN LRG QC 5X300 (BIT) ×2 IMPLANT
BLADE CLIPPER SURG (BLADE) IMPLANT
BRUSH SCRUB EZ PLAIN DRY (MISCELLANEOUS) ×6 IMPLANT
CHLORAPREP W/TINT 26 (MISCELLANEOUS) ×6 IMPLANT
CLIP APPLIE 9.375 MED OPEN (MISCELLANEOUS) IMPLANT
COVER SURGICAL LIGHT HANDLE (MISCELLANEOUS) ×3 IMPLANT
DRAPE C-ARM 42X72 X-RAY (DRAPES) ×3 IMPLANT
DRAPE C-ARMOR (DRAPES) ×3 IMPLANT
DRAPE INCISE IOBAN 66X45 STRL (DRAPES) ×3 IMPLANT
DRAPE ORTHO SPLIT 77X108 STRL (DRAPES) ×6
DRAPE SURG 17X23 STRL (DRAPES) ×18 IMPLANT
DRAPE SURG ORHT 6 SPLT 77X108 (DRAPES) ×2 IMPLANT
DRAPE U-SHAPE 47X51 STRL (DRAPES) ×3 IMPLANT
DRILL BIT CANN 4.5MM (BIT) ×2
DRSG MEPILEX BORDER 4X12 (GAUZE/BANDAGES/DRESSINGS) ×2 IMPLANT
DRSG MEPILEX BORDER 4X4 (GAUZE/BANDAGES/DRESSINGS) ×2 IMPLANT
DRSG MEPILEX BORDER 4X8 (GAUZE/BANDAGES/DRESSINGS) IMPLANT
ELECT BLADE 4.0 EZ CLEAN MEGAD (MISCELLANEOUS) ×3
ELECT BLADE 6.5 EXT (BLADE) IMPLANT
ELECT REM PT RETURN 9FT ADLT (ELECTROSURGICAL) ×6
ELECTRODE BLDE 4.0 EZ CLN MEGD (MISCELLANEOUS) ×1 IMPLANT
ELECTRODE REM PT RTRN 9FT ADLT (ELECTROSURGICAL) ×1 IMPLANT
GLOVE BIO SURGEON STRL SZ 6.5 (GLOVE) ×6 IMPLANT
GLOVE BIO SURGEON STRL SZ7.5 (GLOVE) ×12 IMPLANT
GLOVE BIO SURGEONS STRL SZ 6.5 (GLOVE) ×3
GLOVE BIOGEL PI IND STRL 6.5 (GLOVE) ×1 IMPLANT
GLOVE BIOGEL PI IND STRL 7.5 (GLOVE) ×1 IMPLANT
GLOVE BIOGEL PI INDICATOR 6.5 (GLOVE) ×2
GLOVE BIOGEL PI INDICATOR 7.5 (GLOVE) ×2
GOWN STRL REUS W/ TWL LRG LVL3 (GOWN DISPOSABLE) ×2 IMPLANT
GOWN STRL REUS W/TWL LRG LVL3 (GOWN DISPOSABLE) ×6
GUIDEWIRE 2.0MM (WIRE) ×4 IMPLANT
HANDPIECE INTERPULSE COAX TIP (DISPOSABLE) ×3
KIT BASIN OR (CUSTOM PROCEDURE TRAY) ×3 IMPLANT
KIT TURNOVER KIT B (KITS) ×3 IMPLANT
MANIFOLD NEPTUNE II (INSTRUMENTS) ×3 IMPLANT
NS IRRIG 1000ML POUR BTL (IV SOLUTION) ×3 IMPLANT
PACK TOTAL JOINT (CUSTOM PROCEDURE TRAY) ×3 IMPLANT
PAD ARMBOARD 7.5X6 YLW CONV (MISCELLANEOUS) ×6 IMPLANT
PLATE BONE LOCK 65MM 5 HOLE (Plate) ×2 IMPLANT
PLATE LCP RECON 3.5 7H/98 (Plate) ×2 IMPLANT
RETRIEVER SUT HEWSON (MISCELLANEOUS) ×2 IMPLANT
SCREW CANN 605X115X32 (Screw) ×2 IMPLANT
SCREW CORTEX 3.5 20MM (Screw) ×2 IMPLANT
SCREW CORTEX 3.5 22MM (Screw) ×2 IMPLANT
SCREW CORTEX 3.5 28MM (Screw) ×2 IMPLANT
SCREW CORTEX 3.5 30MM (Screw) ×4 IMPLANT
SCREW CORTEX 3.5 32MM (Screw) ×2 IMPLANT
SCREW CORTEX 3.5 34MM (Screw) ×2 IMPLANT
SCREW CORTEX 3.5 50MM (Screw) ×2 IMPLANT
SCREW CORTEX 3.5 60MM (Screw) ×2 IMPLANT
SCREW CORTEX 3.5X45MM (Screw) ×2 IMPLANT
SCREW LOCK CORT ST 3.5X20 (Screw) IMPLANT
SCREW LOCK CORT ST 3.5X22 (Screw) IMPLANT
SCREW LOCK CORT ST 3.5X28 (Screw) IMPLANT
SCREW LOCK CORT ST 3.5X30 (Screw) IMPLANT
SCREW LOCK CORT ST 3.5X32 (Screw) IMPLANT
SCREW LOCK CORT ST 3.5X34 (Screw) IMPLANT
SET HNDPC FAN SPRY TIP SCT (DISPOSABLE) ×1 IMPLANT
SPONGE LAP 18X18 RF (DISPOSABLE) IMPLANT
STAPLER VISISTAT 35W (STAPLE) IMPLANT
SUCTION FRAZIER HANDLE 10FR (MISCELLANEOUS) ×2
SUCTION TUBE FRAZIER 10FR DISP (MISCELLANEOUS) ×1 IMPLANT
SUT ETHILON 2 0 PSLX (SUTURE) ×6 IMPLANT
SUT FIBERWIRE #2 38 T-5 BLUE (SUTURE)
SUT MNCRL AB 3-0 PS2 18 (SUTURE) ×5 IMPLANT
SUT MON AB 2-0 CT1 36 (SUTURE) ×3 IMPLANT
SUT VIC AB 0 CT1 27 (SUTURE) ×9
SUT VIC AB 0 CT1 27XBRD ANBCTR (SUTURE) ×1 IMPLANT
SUT VIC AB 1 CT1 18XCR BRD 8 (SUTURE) ×1 IMPLANT
SUT VIC AB 1 CT1 27 (SUTURE) ×3
SUT VIC AB 1 CT1 27XBRD ANBCTR (SUTURE) ×1 IMPLANT
SUT VIC AB 1 CT1 8-18 (SUTURE) ×3
SUT VIC AB 2-0 CT1 27 (SUTURE) ×9
SUT VIC AB 2-0 CT1 TAPERPNT 27 (SUTURE) ×1 IMPLANT
SUTURE FIBERWR #2 38 T-5 BLUE (SUTURE) IMPLANT
TOWEL GREEN STERILE (TOWEL DISPOSABLE) ×6 IMPLANT
TOWEL GREEN STERILE FF (TOWEL DISPOSABLE) ×6 IMPLANT
TRAY FOLEY MTR SLVR 16FR STAT (SET/KITS/TRAYS/PACK) IMPLANT
UNDERPAD 30X36 HEAVY ABSORB (UNDERPADS AND DIAPERS) ×3 IMPLANT
WATER STERILE IRR 1000ML POUR (IV SOLUTION) IMPLANT
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
## 2019-09-17 NOTE — Progress Notes (Signed)
Pt. Transported with RN down to pre-op area. Report given to CRNA. RN will continue to monitor.

## 2019-09-17 NOTE — Anesthesia Procedure Notes (Signed)
Procedure Name: Intubation Date/Time: 09/17/2019 8:18 AM Performed by: Kathryne Hitch, CRNA Pre-anesthesia Checklist: Patient identified, Emergency Drugs available, Suction available and Patient being monitored Patient Re-evaluated:Patient Re-evaluated prior to induction Oxygen Delivery Method: Circle system utilized Preoxygenation: Pre-oxygenation with 100% oxygen Induction Type: IV induction Ventilation: Mask ventilation without difficulty Laryngoscope Size: Miller and 3 Grade View: Grade I Tube type: Oral Tube size: 7.5 mm Number of attempts: 1 Airway Equipment and Method: Stylet and Oral airway Placement Confirmation: ETT inserted through vocal cords under direct vision,  positive ETCO2 and breath sounds checked- equal and bilateral Secured at: 22 cm Tube secured with: Tape Dental Injury: Teeth and Oropharynx as per pre-operative assessment

## 2019-09-17 NOTE — Progress Notes (Signed)
Ortho Trauma Note  Patient awake and alert and following commands appropriately.  Asking appropriate questions.  Discussed the risks and benefits of proceeding with open reduction internal fixation of his right acetabulum and left bimalleolar ankle fracture.  The patient is in agreement with this.  The risks included but not limited to bleeding, infection, malunion, nonunion, hardware failure, hardware irritation, posttraumatic arthritis, nerve and blood vessel injury, DVT, even the possibility anesthetic complications.  He agreed to proceed with surgery and consent was obtained.  Shona Needles, MD [facility] [phone] (office) [email or web address]

## 2019-09-17 NOTE — Transfer of Care (Signed)
Immediate Anesthesia Transfer of Care Note  Patient: Tom Gonzalez  Procedure(s) Performed: OPEN REDUCTION INTERNAL FIXATION ACETABULUM FRACTURE POSTERIOR (Right )  Patient Location: PACU  Anesthesia Type:General  Level of Consciousness: drowsy and patient cooperative  Airway & Oxygen Therapy: Patient Spontanous Breathing and Patient connected to face mask oxygen  Post-op Assessment: Report given to RN and Post -op Vital signs reviewed and stable  Post vital signs: Reviewed and stable  Last Vitals:  Vitals Value Taken Time  BP 114/76 09/17/19 1353  Temp    Pulse 115 09/17/19 1355  Resp 27 09/17/19 1355  SpO2 100 % 09/17/19 1355  Vitals shown include unvalidated device data.  Last Pain:  Vitals:   09/17/19 0459  TempSrc:   PainSc: Asleep      Patients Stated Pain Goal: 3 (61/44/31 5400)  Complications: No apparent anesthesia complications
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
## 2019-09-17 NOTE — Anesthesia Postprocedure Evaluation (Signed)
Anesthesia Post Note  Patient: Tom Gonzalez  Procedure(s) Performed: CLOSED REDUCTION HIP (Right Hip) INSERTION OF TRACTION PIN (Right Leg Upper)     Patient location during evaluation: PACU Anesthesia Type: General Level of consciousness: awake and alert Pain management: pain level controlled Vital Signs Assessment: post-procedure vital signs reviewed and stable Respiratory status: spontaneous breathing, nonlabored ventilation, respiratory function stable and patient connected to nasal cannula oxygen Cardiovascular status: blood pressure returned to baseline and stable Postop Assessment: no apparent nausea or vomiting Anesthetic complications: no    Last Vitals:  Vitals:   09/17/19 1608 09/17/19 1623  BP: 125/88 139/80  Pulse: (!) 108 (!) 123  Resp: (!) 40 (!) 21  Temp:    SpO2: 100% 100%    Last Pain:  Vitals:   09/17/19 1608  TempSrc:   PainSc: 0-No pain   Pain Goal: Patients Stated Pain Goal: 3 (09/14/19 1602)      RLE Motor Response: Purposeful movement (09/17/19 1608) RLE Sensation: Full sensation (09/17/19 1608)        Barnet Glasgow

## 2019-09-17 NOTE — Progress Notes (Signed)
Ludger Bones (father) can be reached at [phone]

## 2019-09-18 ENCOUNTER — Encounter (HOSPITAL_COMMUNITY): Payer: Self-pay | Admitting: Student

## 2019-09-18 DIAGNOSIS — S73004A Unspecified dislocation of right hip, initial encounter: Secondary | ICD-10-CM

## 2019-09-18 DIAGNOSIS — S82842A Displaced bimalleolar fracture of left lower leg, initial encounter for closed fracture: Secondary | ICD-10-CM

## 2019-09-18 DIAGNOSIS — F1110 Opioid abuse, uncomplicated: Secondary | ICD-10-CM

## 2019-09-18 LAB — CBC
HCT: 24.5 % — ABNORMAL LOW (ref 39.0–52.0)
Hemoglobin: 8 g/dL — ABNORMAL LOW (ref 13.0–17.0)
MCH: 29 pg (ref 26.0–34.0)
MCHC: 32.7 g/dL (ref 30.0–36.0)
MCV: 88.8 fL (ref 80.0–100.0)
Platelets: 230 10*3/uL (ref 150–400)
RBC: 2.76 MIL/uL — ABNORMAL LOW (ref 4.22–5.81)
RDW: 15.8 % — ABNORMAL HIGH (ref 11.5–15.5)
WBC: 11.8 10*3/uL — ABNORMAL HIGH (ref 4.0–10.5)
nRBC: 0 % (ref 0.0–0.2)

## 2019-09-18 MED ORDER — KETOROLAC TROMETHAMINE 15 MG/ML IJ SOLN
30.0000 mg | Freq: Four times a day (QID) | INTRAMUSCULAR | Status: AC | PRN
  Administered 2019-09-18 – 2019-09-22 (×7): 30 mg via INTRAVENOUS
  Filled 2019-09-18 (×6): qty 2

## 2019-09-18 MED ORDER — METHADONE HCL 10 MG PO TABS
5.0000 mg | ORAL_TABLET | ORAL | Status: DC | PRN
  Administered 2019-09-19 – 2019-09-24 (×16): 5 mg via ORAL
  Filled 2019-09-18 (×16): qty 1

## 2019-09-18 MED ORDER — METHOCARBAMOL 500 MG PO TABS
1000.0000 mg | ORAL_TABLET | Freq: Three times a day (TID) | ORAL | Status: DC
  Administered 2019-09-18 – 2019-09-24 (×18): 1000 mg via ORAL
  Filled 2019-09-18 (×19): qty 2

## 2019-09-18 MED ORDER — METHADONE HCL 5 MG PO TABS: 5.0000 mg | ORAL_TABLET | Freq: Three times a day (TID) | ORAL | Status: DC | PRN

## 2019-09-18 MED ORDER — LORAZEPAM 2 MG/ML IJ SOLN
1.0000 mg | INTRAMUSCULAR | Status: DC | PRN
  Administered 2019-09-20: 1 mg via INTRAVENOUS
  Filled 2019-09-18: qty 1

## 2019-09-18 MED ORDER — LORAZEPAM 1 MG PO TABS
1.0000 mg | ORAL_TABLET | ORAL | Status: DC | PRN
  Administered 2019-09-18 – 2019-09-23 (×10): 1 mg via ORAL
  Filled 2019-09-18 (×9): qty 1

## 2019-09-18 MED ORDER — METHADONE HCL 10 MG PO TABS
10.0000 mg | ORAL_TABLET | Freq: Three times a day (TID) | ORAL | Status: DC
  Administered 2019-09-18 – 2019-09-24 (×18): 10 mg via ORAL
  Filled 2019-09-18: qty 1
  Filled 2019-09-18: qty 2
  Filled 2019-09-18 (×2): qty 1
  Filled 2019-09-18: qty 2
  Filled 2019-09-18 (×8): qty 1
  Filled 2019-09-18: qty 2
  Filled 2019-09-18: qty 1
  Filled 2019-09-18: qty 2
  Filled 2019-09-18 (×3): qty 1

## 2019-09-18 NOTE — Anesthesia Postprocedure Evaluation (Signed)
Anesthesia Post Note  Patient: Tom Gonzalez  Procedure(s) Performed: OPEN REDUCTION INTERNAL FIXATION ACETABULUM FRACTURE POSTERIOR (Right )     Patient location during evaluation: PACU Anesthesia Type: General Level of consciousness: awake and alert Pain management: pain level controlled Vital Signs Assessment: post-procedure vital signs reviewed and stable Respiratory status: spontaneous breathing, nonlabored ventilation, respiratory function stable and patient connected to nasal cannula oxygen Cardiovascular status: blood pressure returned to baseline and stable Postop Assessment: no apparent nausea or vomiting Anesthetic complications: no    Last Vitals:  Vitals:   09/18/19 1900 09/18/19 2000  BP: 114/85 116/82  Pulse: (!) 106 (!) 114  Resp: (!) 26 (!) 24  Temp:  36.9 C  SpO2: 98% 99%    Last Pain:  Vitals:   09/18/19 2000  TempSrc: Oral  PainSc: Asleep   Pain Goal: Patients Stated Pain Goal: 3 (09/14/19 1602)                 Arie Gable L [name]
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
## 2019-09-19 ENCOUNTER — Inpatient Hospital Stay (HOSPITAL_COMMUNITY): Payer: No Typology Code available for payment source

## 2019-09-19 ENCOUNTER — Encounter (HOSPITAL_COMMUNITY): Admission: EM | Disposition: A | Discharge status: Home Health | Payer: Self-pay | Source: Home / Self Care

## 2019-09-19 ENCOUNTER — Inpatient Hospital Stay (HOSPITAL_COMMUNITY): Payer: No Typology Code available for payment source | Admitting: Anesthesiology

## 2019-09-19 ENCOUNTER — Encounter (HOSPITAL_COMMUNITY): Payer: Self-pay | Admitting: Anesthesiology

## 2019-09-19 HISTORY — PX: ORIF ANKLE FRACTURE: SHX5408

## 2019-09-19 LAB — CBC
HCT: 24.3 % — ABNORMAL LOW (ref 39.0–52.0)
Hemoglobin: 7.9 g/dL — ABNORMAL LOW (ref 13.0–17.0)
MCH: 28.7 pg (ref 26.0–34.0)
MCHC: 32.5 g/dL (ref 30.0–36.0)
MCV: 88.4 fL (ref 80.0–100.0)
Platelets: 220 10*3/uL (ref 150–400)
RBC: 2.75 MIL/uL — ABNORMAL LOW (ref 4.22–5.81)
RDW: 16 % — ABNORMAL HIGH (ref 11.5–15.5)
WBC: 9.5 10*3/uL (ref 4.0–10.5)
nRBC: 0 % (ref 0.0–0.2)

## 2019-09-19 LAB — PHOSPHORUS: Phosphorus: 4.4 mg/dL (ref 2.5–4.6)

## 2019-09-19 LAB — BASIC METABOLIC PANEL
Anion gap: 8 (ref 5–15)
BUN: 7 mg/dL (ref 6–20)
CO2: 25 mmol/L (ref 22–32)
Calcium: 8.4 mg/dL — ABNORMAL LOW (ref 8.9–10.3)
Chloride: 104 mmol/L (ref 98–111)
Creatinine, Ser: 0.57 mg/dL — ABNORMAL LOW (ref 0.61–1.24)
GFR calc Af Amer: >60 mL/min (ref >60)
GFR calc non Af Amer: >60 mL/min (ref >60)
Glucose, Bld: 104 mg/dL — ABNORMAL HIGH (ref 70–99)
Potassium: 3.5 mmol/L (ref 3.5–5.1)
Sodium: 137 mmol/L (ref 135–145)

## 2019-09-19 LAB — MAGNESIUM: Magnesium: 2.1 mg/dL (ref 1.7–2.4)

## 2019-09-19 SURGERY — OPEN REDUCTION INTERNAL FIXATION (ORIF) ANKLE FRACTURE
Anesthesia: General | Site: Ankle | Laterality: Left

## 2019-09-19 MED ORDER — FENTANYL CITRATE (PF) 100 MCG/2ML IJ SOLN
INTRAMUSCULAR | Status: AC
  Administered 2019-09-19: 100 ug via INTRAVENOUS
  Filled 2019-09-19: qty 2

## 2019-09-19 MED ORDER — LACTATED RINGERS IV SOLN
INTRAVENOUS | Status: DC
  Administered 2019-09-19 (×2): via INTRAVENOUS

## 2019-09-19 MED ORDER — HYDROMORPHONE HCL 1 MG/ML IJ SOLN: 0.2500 mg | INTRAMUSCULAR | Status: DC | PRN

## 2019-09-19 MED ORDER — ACETAMINOPHEN 500 MG PO TABS
ORAL_TABLET | ORAL | Status: AC
  Filled 2019-09-19: qty 2

## 2019-09-19 MED ORDER — MIDAZOLAM HCL 2 MG/2ML IJ SOLN
INTRAMUSCULAR | Status: AC
  Filled 2019-09-19: qty 2

## 2019-09-19 MED ORDER — FENTANYL CITRATE (PF) 100 MCG/2ML IJ SOLN
INTRAMUSCULAR | Status: DC | PRN
  Administered 2019-09-19: 50 ug via INTRAVENOUS

## 2019-09-19 MED ORDER — FENTANYL CITRATE (PF) 100 MCG/2ML IJ SOLN
100.0000 ug | Freq: Once | INTRAMUSCULAR | Status: AC
  Administered 2019-09-19: 09:00:00 100 ug via INTRAVENOUS

## 2019-09-19 MED ORDER — KETOROLAC TROMETHAMINE 30 MG/ML IJ SOLN
INTRAMUSCULAR | Status: AC
  Filled 2019-09-19: qty 1

## 2019-09-19 MED ORDER — TOBRAMYCIN SULFATE 1.2 G IJ SOLR
INTRAMUSCULAR | Status: AC
  Filled 2019-09-19: qty 1.2

## 2019-09-19 MED ORDER — ONDANSETRON HCL 4 MG/2ML IJ SOLN
INTRAMUSCULAR | Status: DC | PRN
  Administered 2019-09-19: 4 mg via INTRAVENOUS

## 2019-09-19 MED ORDER — PROPOFOL 10 MG/ML IV BOLUS
INTRAVENOUS | Status: AC
  Filled 2019-09-19: qty 20

## 2019-09-19 MED ORDER — PROPOFOL 10 MG/ML IV BOLUS
INTRAVENOUS | Status: DC | PRN
  Administered 2019-09-19: 200 mg via INTRAVENOUS

## 2019-09-19 MED ORDER — ONDANSETRON HCL 4 MG/2ML IJ SOLN
INTRAMUSCULAR | Status: AC
  Filled 2019-09-19: qty 2

## 2019-09-19 MED ORDER — MIDAZOLAM HCL 2 MG/2ML IJ SOLN
2.0000 mg | Freq: Once | INTRAMUSCULAR | Status: AC
  Administered 2019-09-19: 09:00:00 2 mg via INTRAVENOUS

## 2019-09-19 MED ORDER — BACITRACIN ZINC 500 UNIT/GM EX OINT
TOPICAL_OINTMENT | CUTANEOUS | Status: AC
  Filled 2019-09-19: qty 28.35

## 2019-09-19 MED ORDER — VANCOMYCIN HCL 1000 MG IV SOLR
INTRAVENOUS | Status: AC
  Filled 2019-09-19: qty 1000

## 2019-09-19 MED ORDER — FENTANYL CITRATE (PF) 250 MCG/5ML IJ SOLN
INTRAMUSCULAR | Status: AC
  Filled 2019-09-19: qty 5

## 2019-09-19 MED ORDER — BUPIVACAINE HCL 0.5 % IJ SOLN
INTRAMUSCULAR | Status: AC
  Filled 2019-09-19: qty 1

## 2019-09-19 MED ORDER — KETAMINE HCL 10 MG/ML IJ SOLN
INTRAMUSCULAR | Status: DC | PRN
  Administered 2019-09-19: 30 mg via INTRAVENOUS

## 2019-09-19 MED ORDER — BUPIVACAINE-EPINEPHRINE (PF) 0.5% -1:200000 IJ SOLN
INTRAMUSCULAR | Status: DC | PRN
  Administered 2019-09-19: 15 mL via PERINEURAL
  Administered 2019-09-19: 20 mL via PERINEURAL

## 2019-09-19 MED ORDER — LIDOCAINE 2% (20 MG/ML) 5 ML SYRINGE
INTRAMUSCULAR | Status: AC
  Filled 2019-09-19: qty 5

## 2019-09-19 MED ORDER — DEXAMETHASONE SODIUM PHOSPHATE 10 MG/ML IJ SOLN
INTRAMUSCULAR | Status: DC | PRN
  Administered 2019-09-19: 10 mg via INTRAVENOUS

## 2019-09-19 MED ORDER — MIDAZOLAM HCL 2 MG/2ML IJ SOLN
INTRAMUSCULAR | Status: AC
  Administered 2019-09-19: 2 mg via INTRAVENOUS
  Filled 2019-09-19: qty 2

## 2019-09-19 MED ORDER — DEXAMETHASONE SODIUM PHOSPHATE 10 MG/ML IJ SOLN
INTRAMUSCULAR | Status: AC
  Filled 2019-09-19: qty 1

## 2019-09-19 MED ORDER — CEFAZOLIN SODIUM-DEXTROSE 2-4 GM/100ML-% IV SOLN
2.0000 g | Freq: Three times a day (TID) | INTRAVENOUS | Status: AC
  Administered 2019-09-19 – 2019-09-20 (×3): 2 g via INTRAVENOUS
  Filled 2019-09-19 (×3): qty 100

## 2019-09-19 MED ORDER — 0.9 % SODIUM CHLORIDE (POUR BTL) OPTIME
TOPICAL | Status: DC | PRN
  Administered 2019-09-19: 1000 mL

## 2019-09-19 MED ORDER — CEFAZOLIN SODIUM-DEXTROSE 2-4 GM/100ML-% IV SOLN
2.0000 g | Freq: Once | INTRAVENOUS | Status: AC
  Administered 2019-09-19: 09:00:00 2 g via INTRAVENOUS
  Filled 2019-09-19: qty 100

## 2019-09-19 SURGICAL SUPPLY — 63 items
APL PRP STRL LF DISP 70% ISPRP (MISCELLANEOUS) ×1
BANDAGE ESMARK 6X9 LF (GAUZE/BANDAGES/DRESSINGS) ×1 IMPLANT
BIT DRILL QC SFS 2.5X170 (BIT) ×3 IMPLANT
BNDG CMPR 9X6 STRL LF SNTH (GAUZE/BANDAGES/DRESSINGS) ×1
BNDG COHESIVE 4X5 TAN STRL (GAUZE/BANDAGES/DRESSINGS) ×3 IMPLANT
BNDG ELASTIC 4X5.8 VLCR STR LF (GAUZE/BANDAGES/DRESSINGS) ×3 IMPLANT
BNDG ELASTIC 6X5.8 VLCR STR LF (GAUZE/BANDAGES/DRESSINGS) IMPLANT
BNDG ESMARK 6X9 LF (GAUZE/BANDAGES/DRESSINGS) ×3
BOOT STEPPER DURA LG (SOFTGOODS) ×3 IMPLANT
BRUSH SCRUB EZ PLAIN DRY (MISCELLANEOUS) ×6 IMPLANT
CHLORAPREP W/TINT 26 (MISCELLANEOUS) ×3 IMPLANT
CLOSURE WOUND 1/2 X4 (GAUZE/BANDAGES/DRESSINGS) ×1
COVER SURGICAL LIGHT HANDLE (MISCELLANEOUS) ×3 IMPLANT
COVER WAND RF STERILE (DRAPES) IMPLANT
DRAPE C-ARM 42X72 X-RAY (DRAPES) ×3 IMPLANT
DRAPE C-ARMOR (DRAPES) ×3 IMPLANT
DRAPE IMP U-DRAPE 54X76 (DRAPES) ×3 IMPLANT
DRAPE ORTHO SPLIT 77X108 STRL (DRAPES) ×6
DRAPE SURG ORHT 6 SPLT 77X108 (DRAPES) ×2 IMPLANT
DRAPE U-SHAPE 47X51 STRL (DRAPES) ×3 IMPLANT
DRSG ADAPTIC 3X8 NADH LF (GAUZE/BANDAGES/DRESSINGS) IMPLANT
ELECT REM PT RETURN 9FT ADLT (ELECTROSURGICAL) ×3
ELECTRODE REM PT RTRN 9FT ADLT (ELECTROSURGICAL) ×1 IMPLANT
GAUZE SPONGE 4X4 12PLY STRL (GAUZE/BANDAGES/DRESSINGS) IMPLANT
GAUZE SPONGE 4X4 12PLY STRL LF (GAUZE/BANDAGES/DRESSINGS) ×3 IMPLANT
GLOVE BIO SURGEON STRL SZ 6.5 (GLOVE) ×6 IMPLANT
GLOVE BIO SURGEON STRL SZ7.5 (GLOVE) ×9 IMPLANT
GLOVE BIO SURGEONS STRL SZ 6.5 (GLOVE) ×3
GLOVE BIOGEL PI IND STRL 6.5 (GLOVE) ×1 IMPLANT
GLOVE BIOGEL PI IND STRL 7.5 (GLOVE) ×1 IMPLANT
GLOVE BIOGEL PI INDICATOR 6.5 (GLOVE) ×2
GLOVE BIOGEL PI INDICATOR 7.5 (GLOVE) ×2
GOWN STRL REUS W/ TWL LRG LVL3 (GOWN DISPOSABLE) ×2 IMPLANT
GOWN STRL REUS W/TWL LRG LVL3 (GOWN DISPOSABLE) ×6
KIT TURNOVER KIT B (KITS) ×3 IMPLANT
MANIFOLD NEPTUNE II (INSTRUMENTS) IMPLANT
NEEDLE HYPO 21X1.5 SAFETY (NEEDLE) IMPLANT
NEEDLE HYPO 25GX1X1/2 BEV (NEEDLE) IMPLANT
NS IRRIG 1000ML POUR BTL (IV SOLUTION) ×3 IMPLANT
PACK TOTAL JOINT (CUSTOM PROCEDURE TRAY) ×3 IMPLANT
PAD ARMBOARD 7.5X6 YLW CONV (MISCELLANEOUS) ×6 IMPLANT
PAD CAST 4YDX4 CTTN HI CHSV (CAST SUPPLIES) ×1 IMPLANT
PADDING CAST COTTON 4X4 STRL (CAST SUPPLIES) ×3
PADDING CAST COTTON 6X4 STRL (CAST SUPPLIES) IMPLANT
SCREW HEADED ST 3.5X62 (Screw) ×3 IMPLANT
SCREW PELVIC CORT ST 3.5X100 (Screw) ×1 IMPLANT
SCREW SELF TAP 3.5 100 (Screw) ×2 IMPLANT
SPONGE LAP 18X18 RF (DISPOSABLE) IMPLANT
STAPLER VISISTAT 35W (STAPLE) ×3 IMPLANT
STRIP CLOSURE SKIN 1/2X4 (GAUZE/BANDAGES/DRESSINGS) ×2 IMPLANT
SUCTION FRAZIER HANDLE 10FR (MISCELLANEOUS) ×2
SUCTION TUBE FRAZIER 10FR DISP (MISCELLANEOUS) ×1 IMPLANT
SUT ETHILON 3 0 PS 1 (SUTURE) ×3 IMPLANT
SUT PROLENE 0 CT (SUTURE) IMPLANT
SUT VIC AB 0 CT1 27 (SUTURE) ×3
SUT VIC AB 0 CT1 27XBRD ANBCTR (SUTURE) ×1 IMPLANT
SUT VIC AB 2-0 CT1 27 (SUTURE) ×3
SUT VIC AB 2-0 CT1 TAPERPNT 27 (SUTURE) ×1 IMPLANT
SYR CONTROL 10ML LL (SYRINGE) ×3 IMPLANT
TOWEL GREEN STERILE (TOWEL DISPOSABLE) ×6 IMPLANT
TOWEL GREEN STERILE FF (TOWEL DISPOSABLE) ×3 IMPLANT
UNDERPAD 30X30 (UNDERPADS AND DIAPERS) ×3 IMPLANT
WATER STERILE IRR 1000ML POUR (IV SOLUTION) ×3 IMPLANT

## 2019-09-19 NOTE — Progress Notes (Signed)
Pt arrived on floor, transferred from PACU.  Patient is A&OX4 and in no acute distress. Pt states pain is 7/10, and requesting methadone PRN. Denies nausea. LLE with splint and ice pack, he is able to wiggle toes but it remains numb as expected from block. R hip remains with dsg, c/d/i. Pt educated on weight bearing restrictions.   Patient orienated to room and unit. Bedside table and personal belongings within reach of patient. Patient educated on usage of nurse call bell, placed within reach of patient. Patient instructed to call for assistance. Patient in bed. Will continue to monitor.

## 2019-09-19 NOTE — Anesthesia Procedure Notes (Signed)
Anesthesia Regional Block: Popliteal block   Pre-Anesthetic Checklist: ,, timeout performed, Correct Patient, Correct Site, Correct Laterality, Correct Procedure, Correct Position, site marked, Risks and benefits discussed, pre-op evaluation,  At surgeon's request and post-op pain management  Laterality: Left  Prep: Maximum Sterile Barrier Precautions used, chloraprep       Needles:  Injection technique: Single-shot  Needle Type: Echogenic Stimulator Needle     Needle Length: 9cm  Needle Gauge: 21     Additional Needles:   Procedures:,,,, ultrasound used (permanent image in chart),,,,  Narrative:  Start time: 09/19/2019 8:30 AM End time: 09/19/2019 8:40 AM Injection made incrementally with aspirations every 5 mL. Anesthesiologist: Roderic Palau, MD  Additional Notes: 2% Lidocaine skin wheel. Adductor canal block with 15cc of 0.5% Bupivicaine w/1:200k epi.

## 2019-09-19 NOTE — Progress Notes (Signed)
Orthopedic Tech Progress Note Patient Details:  Tom Gonzalez [birth year] [id number]  Ortho Devices Type of Ortho Device: CAM walker Ortho Device/Splint Location: LLE Ortho Device/Splint Interventions: Application   Post Interventions Patient Tolerated: Well Instructions Provided: Care of device   [name] 09/19/2019, 10:22 AM
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
## 2019-09-19 NOTE — Anesthesia Postprocedure Evaluation (Signed)
Anesthesia Post Note  Patient: Tom Gonzalez  Procedure(s) Performed: OPEN REDUCTION INTERNAL FIXATION (ORIF) ANKLE FRACTURE (Left Ankle)     Patient location during evaluation: PACU Anesthesia Type: General and Regional Level of consciousness: awake and alert Pain management: pain level controlled Vital Signs Assessment: post-procedure vital signs reviewed and stable Respiratory status: spontaneous breathing, nonlabored ventilation, respiratory function stable and patient connected to nasal cannula oxygen Cardiovascular status: blood pressure returned to baseline and stable Postop Assessment: no apparent nausea or vomiting Anesthetic complications: no    Last Vitals:  Vitals:   09/19/19 1136 09/19/19 1156  BP: 109/65 (!) 118/97  Pulse: (!) 106 (!) 105  Resp: 20 19  Temp:  (!) 36.4 C  SpO2: 98% 98%    Last Pain:  Vitals:   09/19/19 1156  TempSrc: Oral  PainSc:                  Madsen Riddle,W. EDMOND

## 2019-09-19 NOTE — Progress Notes (Signed)
Orthopedic Tech Progress Note Patient Details:  Tom Gonzalez 02/24/1996 [id number]  Ortho Devices Type of Ortho Device: CAM walker Ortho Device/Splint Location: LLE Ortho Device/Splint Interventions: Application   Post Interventions Patient Tolerated: Well Instructions Provided: Care of device   [name] 09/19/2019, 10:23 AM
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
## 2019-09-19 NOTE — Progress Notes (Signed)
OT Cancellation Note  Patient Details Name: Tom Gonzalez MRN: [id number] DOB: April 13, 1996   Cancelled Treatment:    Reason Eval/Treat Not Completed: Patient at procedure or test/ unavailable(ORIF today with Dr. Doreatha Martin) OT to watch for post operative notes for most appropriate time to evaluate   Brynn, OTR/L  Acute Rehabilitation Services Pager: [phone] Office: [phone] .  Jeri Modena 09/19/2019, 8:27 AM

## 2019-09-19 NOTE — Transfer of Care (Signed)
Immediate Anesthesia Transfer of Care Note  Patient: Tom Gonzalez  Procedure(s) Performed: OPEN REDUCTION INTERNAL FIXATION (ORIF) ANKLE FRACTURE (Left Ankle)  Patient Location: PACU  Anesthesia Type:General  Level of Consciousness: oriented, sedated and patient cooperative  Airway & Oxygen Therapy: Patient Spontanous Breathing and Patient connected to nasal cannula oxygen  Post-op Assessment: Report given to RN and Post -op Vital signs reviewed and stable  Post vital signs: Reviewed  Last Vitals:  Vitals Value Taken Time  BP 118/78 09/19/19 1036  Temp    Pulse 113 09/19/19 1039  Resp 24 09/19/19 1039  SpO2 99 % 09/19/19 1039  Vitals shown include unvalidated device data.  Last Pain:  Vitals:   09/19/19 0415  TempSrc:   PainSc: Asleep      Patients Stated Pain Goal: 3 (01/60/10 9323)  Complications: No apparent anesthesia complications
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
## 2019-09-19 NOTE — Anesthesia Preprocedure Evaluation (Addendum)
Anesthesia Evaluation  Patient identified by MRN, date of birth, ID band Patient awake    Reviewed: Allergy & Precautions, H&P , NPO status , Patient's Chart, lab work & pertinent test results  Airway Mallampati: II  TM Distance: >3 FB Neck ROM: Full    Dental no notable dental hx. (+) Poor Dentition, Dental Advisory Given   Pulmonary Current Smoker and Patient abstained from smoking.,    Pulmonary exam normal breath sounds clear to auscultation       Cardiovascular negative cardio ROS   Rhythm:Regular Rate:Normal     Neuro/Psych negative neurological ROS  negative psych ROS   GI/Hepatic negative GI ROS, (+)     substance abuse  IV drug use,   Endo/Other  negative endocrine ROS  Renal/GU negative Renal ROS  negative genitourinary   Musculoskeletal  (+) narcotic dependent  Abdominal   Peds  Hematology negative hematology ROS (+)   Anesthesia Other Findings   Reproductive/Obstetrics negative OB ROS                          Anesthesia Physical Anesthesia Plan  ASA: II  Anesthesia Plan: General   Post-op Pain Management:  Regional for Post-op pain   Induction: Intravenous  PONV Risk Score and Plan: 1 and Ondansetron, Dexamethasone and Midazolam  Airway Management Planned: LMA  Additional Equipment:   Intra-op Plan:   Post-operative Plan: Extubation in OR  Informed Consent: I have reviewed the patients History and Physical, chart, labs and discussed the procedure including the risks, benefits and alternatives for the proposed anesthesia with the patient or authorized representative who has indicated his/her understanding and acceptance.     Dental advisory given  Plan Discussed with: CRNA  Anesthesia Plan Comments:         Anesthesia Quick Evaluation

## 2019-09-19 NOTE — Anesthesia Procedure Notes (Signed)
Procedure Name: LMA Insertion Date/Time: 09/19/2019 9:11 AM Performed by: Jenne Campus, CRNA Pre-anesthesia Checklist: Patient identified, Emergency Drugs available, Suction available and Patient being monitored Patient Re-evaluated:Patient Re-evaluated prior to induction Oxygen Delivery Method: Circle System Utilized Preoxygenation: Pre-oxygenation with 100% oxygen Induction Type: IV induction Ventilation: Mask ventilation without difficulty LMA: LMA inserted LMA Size: 4.0 Number of attempts: 1 Placement Confirmation: positive ETCO2 and breath sounds checked- equal and bilateral Tube secured with: Tape Dental Injury: Teeth and Oropharynx as per pre-operative assessment

## 2019-09-20 LAB — BASIC METABOLIC PANEL
Anion gap: 9 (ref 5–15)
BUN: 8 mg/dL (ref 6–20)
CO2: 26 mmol/L (ref 22–32)
Calcium: 8.5 mg/dL — ABNORMAL LOW (ref 8.9–10.3)
Chloride: 104 mmol/L (ref 98–111)
Creatinine, Ser: 0.59 mg/dL — ABNORMAL LOW (ref 0.61–1.24)
GFR calc Af Amer: >60 mL/min (ref >60)
GFR calc non Af Amer: >60 mL/min (ref >60)
Glucose, Bld: 114 mg/dL — ABNORMAL HIGH (ref 70–99)
Potassium: 3.6 mmol/L (ref 3.5–5.1)
Sodium: 139 mmol/L (ref 135–145)

## 2019-09-20 LAB — CBC
HCT: 24.5 % — ABNORMAL LOW (ref 39.0–52.0)
Hemoglobin: 7.9 g/dL — ABNORMAL LOW (ref 13.0–17.0)
MCH: 28.7 pg (ref 26.0–34.0)
MCHC: 32.2 g/dL (ref 30.0–36.0)
MCV: 89.1 fL (ref 80.0–100.0)
Platelets: 296 10*3/uL (ref 150–400)
RBC: 2.75 MIL/uL — ABNORMAL LOW (ref 4.22–5.81)
RDW: 16.1 % — ABNORMAL HIGH (ref 11.5–15.5)
WBC: 12.1 10*3/uL — ABNORMAL HIGH (ref 4.0–10.5)
nRBC: 0 % (ref 0.0–0.2)

## 2019-09-20 LAB — PHOSPHORUS: Phosphorus: 3.8 mg/dL (ref 2.5–4.6)

## 2019-09-20 LAB — MAGNESIUM: Magnesium: 2.1 mg/dL (ref 1.7–2.4)

## 2019-09-20 MED ORDER — ALUM & MAG HYDROXIDE-SIMETH 200-200-20 MG/5ML PO SUSP
30.0000 mL | ORAL | Status: DC | PRN
  Administered 2019-09-20 – 2019-09-23 (×4): 30 mL via ORAL
  Filled 2019-09-20 (×4): qty 30
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
## 2019-09-21 LAB — CBC
HCT: 25.2 % — ABNORMAL LOW (ref 39.0–52.0)
Hemoglobin: 8 g/dL — ABNORMAL LOW (ref 13.0–17.0)
MCH: 28.9 pg (ref 26.0–34.0)
MCHC: 31.7 g/dL (ref 30.0–36.0)
MCV: 91 fL (ref 80.0–100.0)
Platelets: 321 10*3/uL (ref 150–400)
RBC: 2.77 MIL/uL — ABNORMAL LOW (ref 4.22–5.81)
RDW: 16.9 % — ABNORMAL HIGH (ref 11.5–15.5)
WBC: 9.8 10*3/uL (ref 4.0–10.5)
nRBC: 0 % (ref 0.0–0.2)

## 2019-09-21 LAB — BASIC METABOLIC PANEL
Anion gap: 9 (ref 5–15)
BUN: 10 mg/dL (ref 6–20)
CO2: 24 mmol/L (ref 22–32)
Calcium: 8.4 mg/dL — ABNORMAL LOW (ref 8.9–10.3)
Chloride: 106 mmol/L (ref 98–111)
Creatinine, Ser: 0.64 mg/dL (ref 0.61–1.24)
GFR calc Af Amer: >60 mL/min (ref >60)
GFR calc non Af Amer: >60 mL/min (ref >60)
Glucose, Bld: 136 mg/dL — ABNORMAL HIGH (ref 70–99)
Potassium: 4 mmol/L (ref 3.5–5.1)
Sodium: 139 mmol/L (ref 135–145)

## 2019-09-21 LAB — MAGNESIUM: Magnesium: 1.9 mg/dL (ref 1.7–2.4)

## 2019-09-21 LAB — PHOSPHORUS: Phosphorus: 4 mg/dL (ref 2.5–4.6)
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
## 2019-09-22 MED ORDER — GABAPENTIN 100 MG PO CAPS
200.0000 mg | ORAL_CAPSULE | Freq: Three times a day (TID) | ORAL | Status: DC
  Administered 2019-09-22 – 2019-09-24 (×7): 200 mg via ORAL
  Filled 2019-09-22 (×7): qty 2

## 2019-09-22 MED ORDER — POLYETHYLENE GLYCOL 3350 17 G PO PACK
17.0000 g | PACK | Freq: Every day | ORAL | Status: DC
  Filled 2019-09-22 (×2): qty 1

## 2019-09-22 NOTE — Plan of Care (Signed)

## 2019-09-22 NOTE — Progress Notes (Signed)
Orthopaedic Trauma Service (OTS)  3 Days Post-Op Procedure(s) (LRB): OPEN REDUCTION INTERNAL FIXATION (ORIF) ANKLE FRACTURE (Left)  Subjective: Patient reports pain as severe enough to consider increasing baseline methadone dose; however, we did awaken him on entry.    Objective: Current Vitals Blood pressure 116/78, pulse 85, temperature 98.3 F (36.8 C), temperature source Oral, resp. rate 18, SpO2 99 %. Vital signs in last 24 hours: Temp:  [98.3 F (36.8 C)-98.7 F (37.1 C)] 98.3 F (36.8 C) (11/28 1603) Pulse Rate:  [85-110] 85 (11/28 1603) Resp:  [16-19] 18 (11/28 1603) BP: (115-131)/(77-85) 116/78 (11/28 1603) SpO2:  [97 %-99 %] 99 % (11/28 1603)  Intake/Output from previous day: 11/27 0701 - 11/28 0700 In: 1100 [I.V.:1100] Out: 3125 [Urine:3125]  LABS Recent Labs    09/20/19 0319 09/21/19 0532  HGB 7.9* 8.0*   Recent Labs    09/20/19 0319 09/21/19 0532  WBC 12.1* 9.8  RBC 2.75* 2.77*  HCT 24.5* 25.2*  PLT 296 321   Recent Labs    09/20/19 0319 09/21/19 0532  NA 139 139  K 3.6 4.0  CL 104 106  CO2 26 24  BUN 8 10  CREATININE 0.59* 0.64  GLUCOSE 114* 136*  CALCIUM 8.5* 8.4*   No results for input(s): LABPT, INR in the last 72 hours.   Physical Exam RLE Dressing intact, clean, scant drainage now after a change this am with nursing  Edema/ swelling controlled  Sens: DPN, SPN, TN intact  Motor: EHL, FHL, and lessor toe ext and flex all intact grossly  Brisk cap refill, warm to touch  Assessment/Plan: 3 Days Post-Op Procedure(s) (LRB): OPEN REDUCTION INTERNAL FIXATION (ORIF) ANKLE FRACTURE (Left) 1. PT/OT  2. DVT proph Lovenox 3. Drainage is decreasing, continue to follow  [place], MD [facility] [phone] (p)

## 2019-09-22 NOTE — Progress Notes (Signed)
OT Cancellation Note  Patient Details Name: Tom Gonzalez MRN: [id number] DOB: [birth year]   Cancelled Treatment:    Reason Eval/Treat Not Completed: Pain limiting ability to participate.  RN reports pt premedicated. Pt sleeping soundly and difficult to arouse.  Once he aroused, he reports his pain has been too bad and he is unable to even move to EOB today - "it was so bad last night I didn't sleep much"... "I'm really sorry, I will do it tomorrow".  Will reattempt.  Lucille Passy, OTR/L Acute Rehabilitation Services Pager [phone] Office [phone]   Lucille Passy M 09/22/2019, 4:44 PM

## 2019-09-22 NOTE — TOC Progression Note (Signed)
Transition of Care [facility]) - Progression Note    Patient Details  Name: LUC SHAMMAS MRN: [id number] Date of Birth: [birth year]  Transition of Care [facility]) CM/SW [place], LCSW Phone Number: 09/22/2019, 11:34 AM  Clinical Narrative:  CSW emailed financial counseling to inform them of patient's request to speak with someone in financial counseling about applying for Medicaid.    Expected Discharge Plan: OP Rehab Barriers to Discharge: Continued Medical Work up  Expected Discharge Plan and Services Expected Discharge Plan: OP Rehab   Discharge Planning Services: CM Consult Post Acute Care Choice: [place] arrangements for the past 2 months: Single Family Home                           HH Arranged: PT, OT           Social Determinants of Health (SDOH) Interventions    Readmission Risk Interventions No flowsheet data found.
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
## 2019-09-23 MED ORDER — KETOROLAC TROMETHAMINE 30 MG/ML IJ SOLN
30.0000 mg | Freq: Four times a day (QID) | INTRAMUSCULAR | Status: DC | PRN
  Administered 2019-09-23: 30 mg via INTRAVENOUS
  Filled 2019-09-23 (×2): qty 1
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
## 2019-09-23 NOTE — Plan of Care (Signed)

## 2019-09-24 ENCOUNTER — Encounter (HOSPITAL_COMMUNITY): Payer: Self-pay | Admitting: Student

## 2019-09-24 LAB — BASIC METABOLIC PANEL
Anion gap: 9 (ref 5–15)
BUN: 14 mg/dL (ref 6–20)
CO2: 27 mmol/L (ref 22–32)
Calcium: 9.1 mg/dL (ref 8.9–10.3)
Chloride: 102 mmol/L (ref 98–111)
Creatinine, Ser: 0.61 mg/dL (ref 0.61–1.24)
GFR calc Af Amer: >60 mL/min (ref >60)
GFR calc non Af Amer: >60 mL/min (ref >60)
Glucose, Bld: 124 mg/dL — ABNORMAL HIGH (ref 70–99)
Potassium: 4.1 mmol/L (ref 3.5–5.1)
Sodium: 138 mmol/L (ref 135–145)

## 2019-09-24 LAB — CBC
HCT: 29.7 % — ABNORMAL LOW (ref 39.0–52.0)
Hemoglobin: 9.3 g/dL — ABNORMAL LOW (ref 13.0–17.0)
MCH: 28.7 pg (ref 26.0–34.0)
MCHC: 31.3 g/dL (ref 30.0–36.0)
MCV: 91.7 fL (ref 80.0–100.0)
Platelets: 490 10*3/uL — ABNORMAL HIGH (ref 150–400)
RBC: 3.24 MIL/uL — ABNORMAL LOW (ref 4.22–5.81)
RDW: 17.7 % — ABNORMAL HIGH (ref 11.5–15.5)
WBC: 11.2 10*3/uL — ABNORMAL HIGH (ref 4.0–10.5)
nRBC: 0 % (ref 0.0–0.2)

## 2019-09-24 LAB — PHOSPHORUS: Phosphorus: 4.6 mg/dL (ref 2.5–4.6)

## 2019-09-24 LAB — MAGNESIUM: Magnesium: 2.2 mg/dL (ref 1.7–2.4)

## 2019-09-24 MED ORDER — NICOTINE 21 MG/24HR TD PT24: 21.0000 mg | MEDICATED_PATCH | Freq: Every day | TRANSDERMAL | 0 refills | Status: AC

## 2019-09-24 MED ORDER — METHOCARBAMOL 500 MG PO TABS: 1000.0000 mg | ORAL_TABLET | Freq: Three times a day (TID) | ORAL | 0 refills | Status: AC

## 2019-09-24 MED ORDER — DOCUSATE SODIUM 100 MG PO CAPS: 100.0000 mg | ORAL_CAPSULE | Freq: Two times a day (BID) | ORAL | Status: AC | PRN

## 2019-09-24 MED ORDER — GABAPENTIN 300 MG PO CAPS: 300.0000 mg | ORAL_CAPSULE | Freq: Three times a day (TID) | ORAL | 0 refills | Status: AC

## 2019-09-24 MED ORDER — OXYCODONE HCL 10 MG PO TABS: 10.0000 mg | ORAL_TABLET | ORAL | 0 refills | Status: AC | PRN

## 2019-09-24 MED ORDER — ACETAMINOPHEN 500 MG PO TABS: 1000.0000 mg | ORAL_TABLET | Freq: Four times a day (QID) | ORAL | Status: AC | PRN

## 2019-09-24 MED ORDER — ENOXAPARIN SODIUM 40 MG/0.4ML ~~LOC~~ SOLN: 40.0000 mg | SUBCUTANEOUS | 0 refills | Status: AC

## 2019-09-24 MED FILL — ENOXAPARIN SODIUM 40 MG/0.4: 40 | 28 days supply | Qty: 11 | Fill #0

## 2019-09-24 MED FILL — oxyCODONE HCL 10 MG TABS: 10 | 5 days supply | Qty: 40 | Fill #0

## 2019-09-24 MED FILL — METHOCARBAMOL 500 MG TABS: 500 | 10 days supply | Qty: 60 | Fill #0

## 2019-09-24 MED FILL — GABAPENTIN 300 MG CAPSULE: 300 | 30 days supply | Qty: 90 | Fill #0
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
## 2019-09-24 NOTE — Discharge Instructions (Signed)
Walking Boot, Adult  A walking boot is a medical device that holds your foot or ankle in place after an injury or a medical procedure. This helps with healing and prevents further injury. A walking boot is a removable boot-shaped splint. It has a hard, rigid outer frame that limits movement and supports your foot and leg. The inner lining is a layer of padded material. Walking boots usually have adjustable straps to secure them over the foot and leg. Your health care provider may prescribe a walking boot if you can put weight (bear weight) on your injured foot. How much you can walk while wearing the boot will depend on the type and severity of your injury. Your health care provider will recommend the best boot for you based on your condition. How do I put on my walking boot? There are different types of walking boots. Each type of boot has specific instructions about how to wear it properly. Follow instructions from your health care provider about wearing your boot. In general:  Ask someone to help you put on the boot, if needed.  Sit to put on your boot. Doing this is more comfortable and it helps to prevent falls.  Open up the boot fully. Place your foot into the boot so your heel rests against the back.  Your toes should be supported by the base of the boot. They should not hang over the front edge.  Adjust the straps so the boot fits securely but is not too tight.  Do not bend the hard frame of the boot to get a good fit. What are some tips for walking with a walking boot?  Do not try to walk without wearing the boot unless your health care provider has approved.  Use other assistive walking devices, including crutches and canes, as told by your health care provider.  On your uninjured foot, wear a shoe with a heel that is close to the height of the walking boot.  Be careful when walking on surfaces that are uneven or wet. How can I reduce swelling while using a walking boot?    Rest your injured foot or leg as much as possible.  If directed, apply ice to the injured area: ? Put ice in a plastic bag. ? Place a towel between your skin and the bag. ? Leave the ice on for 20 minutes, 2-3 times a day.  Keep your injured foot or leg raised (elevated) above the level of your heart whenever able. Try to do this for at least 2?3 hours each day or as told by your health care provider.  If swelling gets worse, loosen the boot and rest and elevate your foot and leg. How should I take care of my skin and foot while using a walking boot?  Wear a long sock to protect your foot and leg from rubbing inside the boot.  Take off the boot one time each day to check the injured area. Look at your foot, surrounding skin, and leg to make sure there are no sores, rashes, swelling, or wounds. The skin should be a healthy color, not pale or blue.  Try to notice if your walking pattern (gait) in the boot is fairly normal and that you are not walking with a noticeable limp.  Follow instructions from your health care provider about taking care of your incision or wound, if this applies.  Clean and wash the injured area as told by your health care provider.  Gently dry   your foot and leg before putting the boot back on. Are there any activity restrictions? Activity restrictions depend on the type and severity of your injury. Follow instructions from your health care provider about limiting activities.  Bathe and shower as told by your health care provider.  Do not do any activities that could make your injury worse.  Do not drive if your affected foot is one that you usually use for driving. When can I remove my walking boot? Always follow specific directions from your health care provider for removing the walking boot. Generally, it is okay to remove your walking boot:  At the end of the day when you are resting or sleeping.  To clean your foot and leg. How should I keep my walking  boot clean?  Do not put any part of the boot in a washing machine or dryer.  Do not use chemical cleaning products. These could irritate your skin, especially if you have a wound or an incision.  Do not soak the liner of the boot.  Use a washcloth with mild soap and water to clean the frame and the liner of the boot by hand.  Allow the boot to air-dry completely before you put it back on your foot. Contact a health care provider if:  The boot is cracked or damaged.  The boot does not fit properly.  Your foot or leg hurts.  You have a rash, sore, or open sore (ulcer) on your foot or leg.  The skin on your foot or leg is pale.  You have a wound or incision on the foot and it is getting worse.  Your skin becomes painful, red, or irritated.  Your swelling does not get better or it gets worse. Get help right away if:  You cannot feel your foot or leg (have numbness).  You cannot feel a pulse at the top of your foot, where your foot and ankle meet.  Your skin on the foot or leg is cold, blue, or gray. Summary  A walking boot holds your foot or ankle in place after an injury or a medical procedure.  There are different types of walking boots. Follow the specific instructions about how to correctly wear the boot that you have.  Ask someone to help you put on the boot, if needed.  It is important to check your skin and foot every day. Call your health care provider if you notice a rash or sore on your foot or leg. This information is not intended to replace advice given to you by your health care provider. Make sure you discuss any questions you have with your health care provider. Document Released: 02/25/2015 Document Revised: 01/30/2019 Document Reviewed: 11/18/2016 Elsevier Patient Education  2020 [employer] Inc.  
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
## 2020-03-26 IMAGING — CT CT HIP*R* W/O CM
2 of 4 series · 16 of 46 positions shown, 19 images · non-contrast
Comparison: Radiographs dated 09/17/2019 and 09/14/2019 and CT scan
dated 09/14/2019

CLINICAL DATA: Right acetabular fracture.

EXAM:
CT OF THE RIGHT HIP WITHOUT CONTRAST
TECHNIQUE: Multidetector CT imaging of the right hip was performed according to
the standard protocol. Multiplanar CT image reconstructions were
also generated.

[Series 5: soft tissue (person_name) · axial · 0.48mm/px · z∈[-290,-96]mm · 13 of 108 slices shown, 16 images]
[im 7/108  soft-tissue]
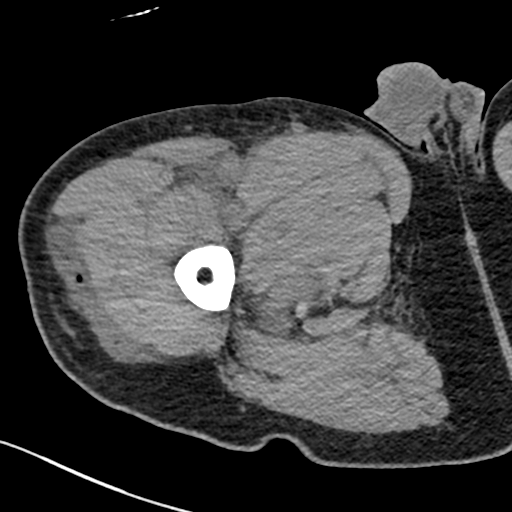
[im 7/108  bone]
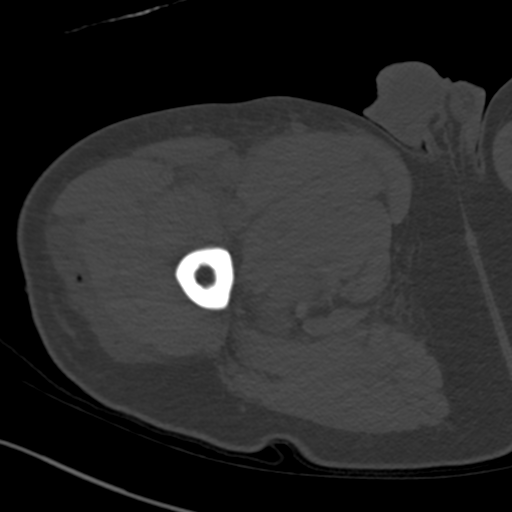
[im 18/108  soft-tissue]
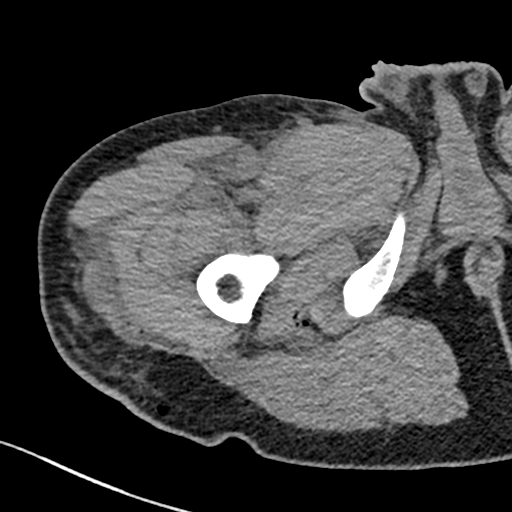
[im 28/108  soft-tissue]
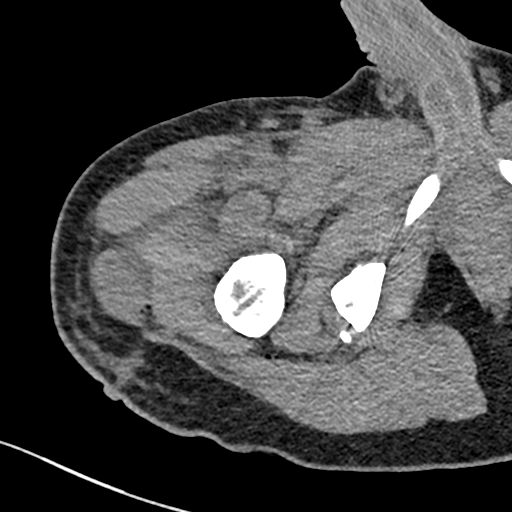
[im 38/108  soft-tissue]
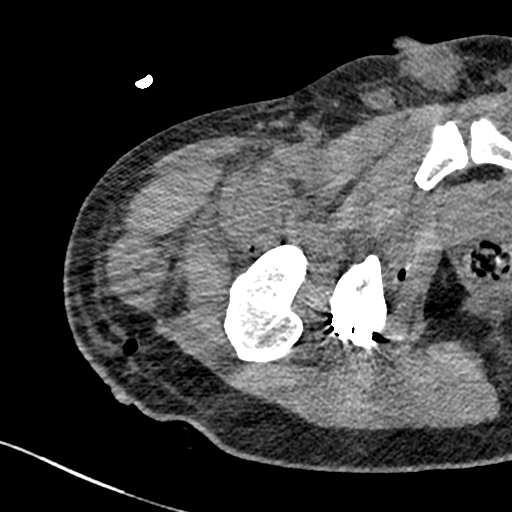
[im 49/108  soft-tissue]
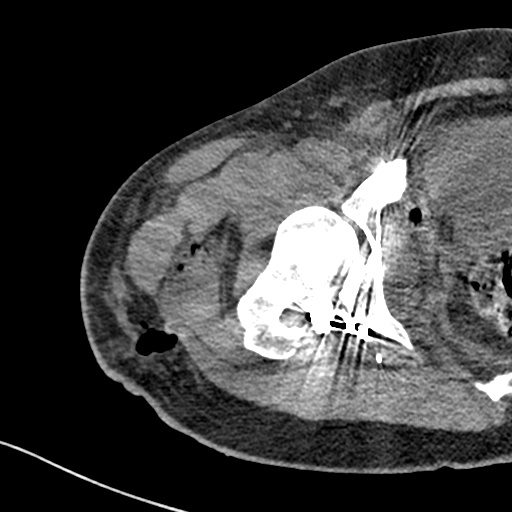
[im 59/108  soft-tissue]
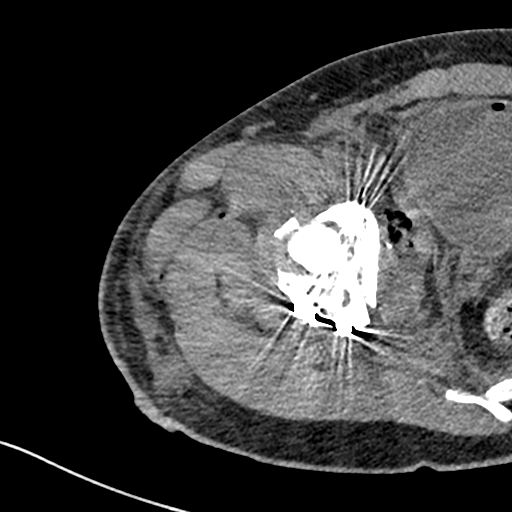
[im 70/108  soft-tissue]
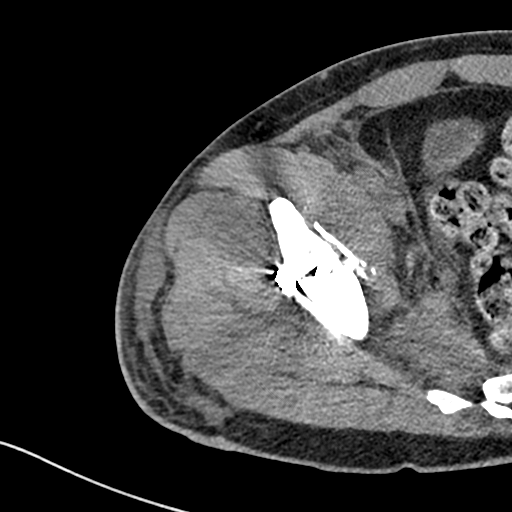
[im 80/108  soft-tissue]
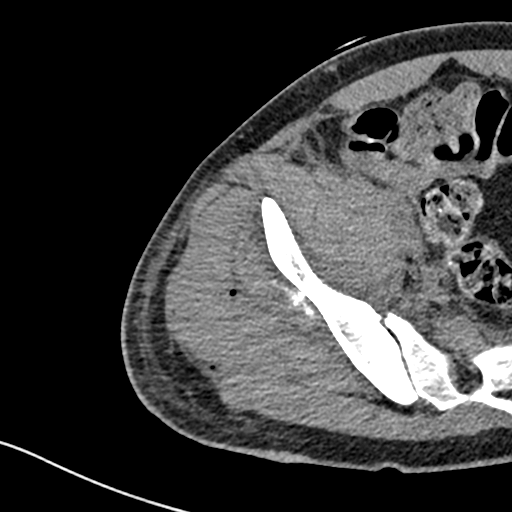
[im 90/108  soft-tissue]
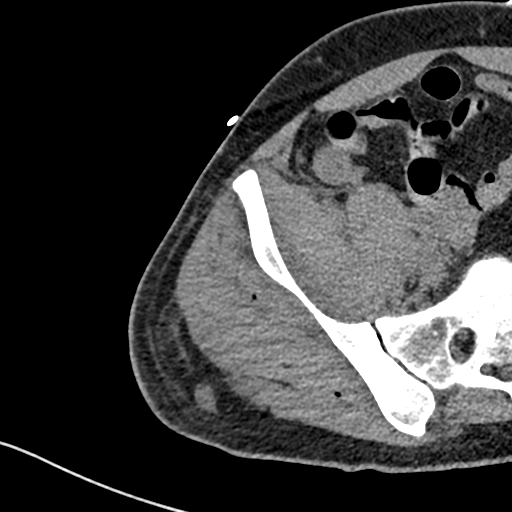
[im 90/108  bone]
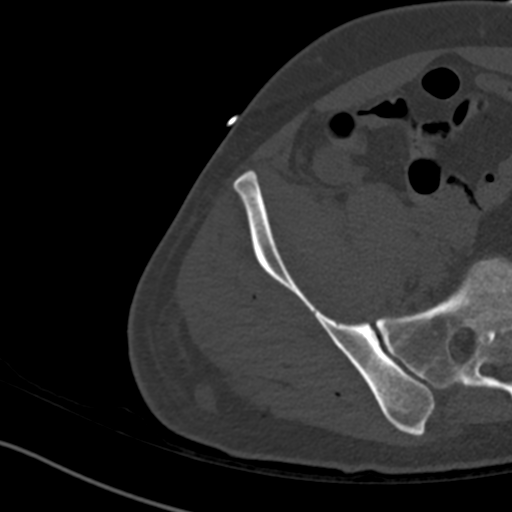
[im 94/108  lung]
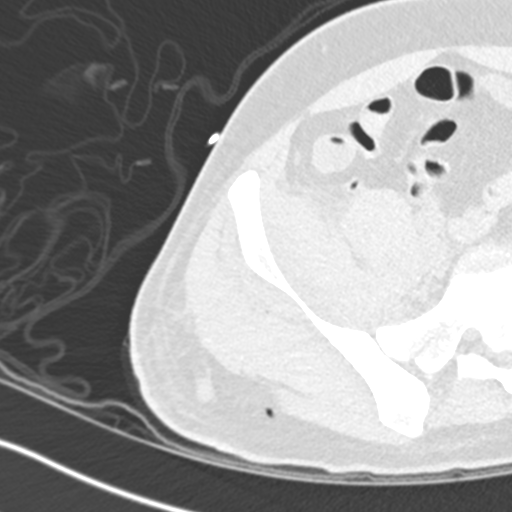
[im 97/108  lung]
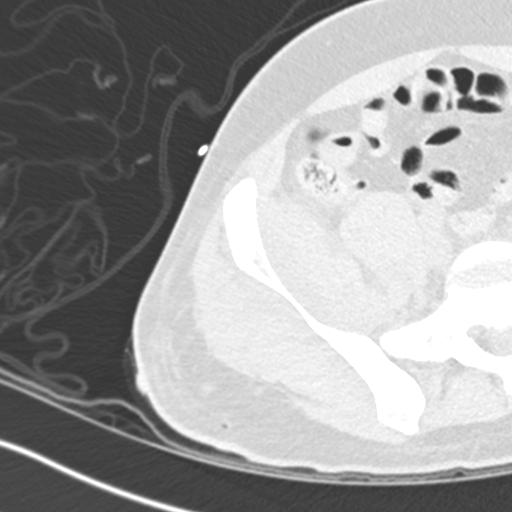
[im 101/108  soft-tissue]
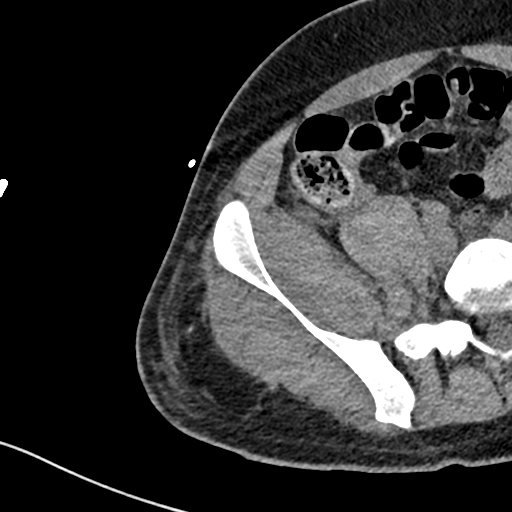
[im 101/108  lung]
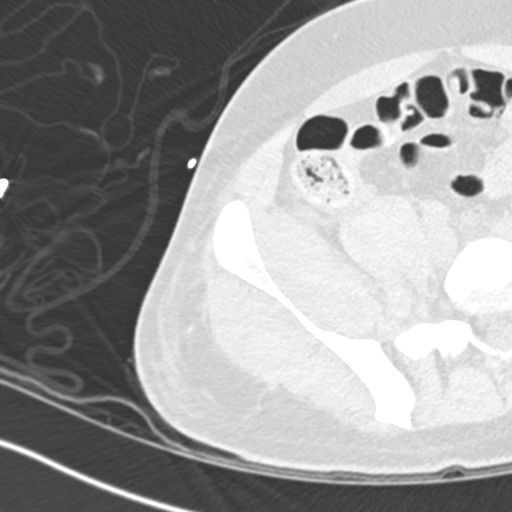
[im 104/108  lung]
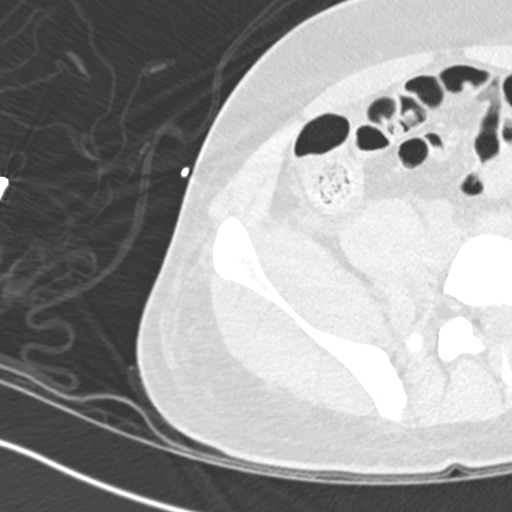

[Series 6: cor soft (person_name) · coronal · 0.44mm/px · 3 of 124 slices shown]
[im 42/124  soft-tissue]
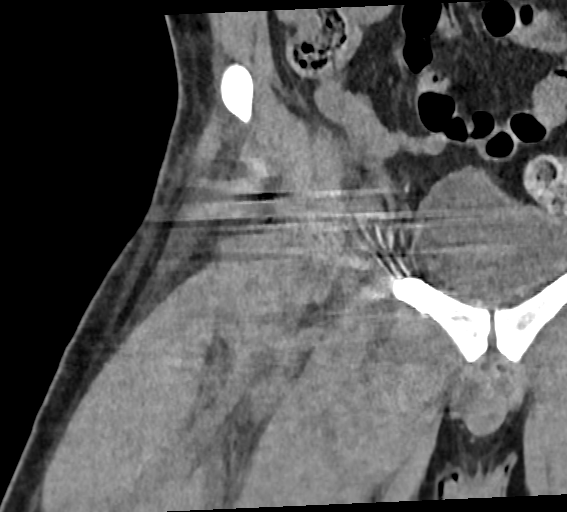
[im 55/124  soft-tissue]
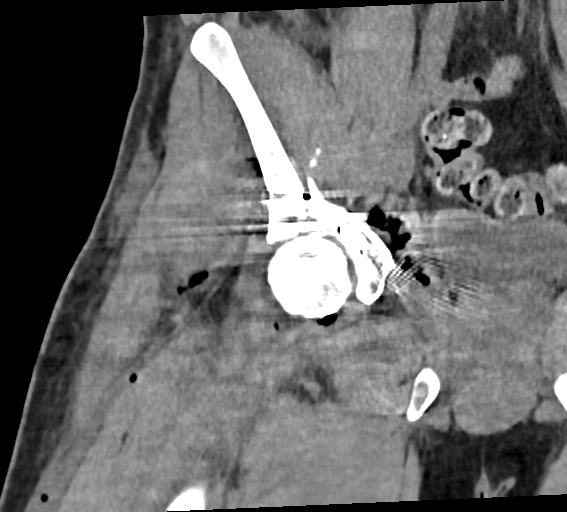
[im 69/124  soft-tissue]
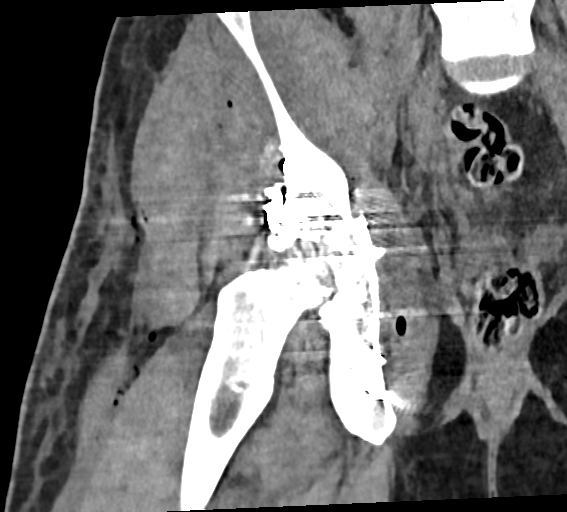

[16 of 46 positions shown; findings below may reference images not displayed]

FINDINGS: Bones/Joint/Cartilage

The patient has undergone open reduction and internal fixation of
fracture of the right acetabulum. Alignment position of the fracture
fragments is markedly improved. Plates and screws appear in good
position.

There is now impaction fracture lateral aspect of the femoral head
which was not apparent on the prior exam.

Muscles and Tendons

There is a new subcutaneous hematoma superficial to the vastus
lateralis muscle in the lateral aspect of the right thigh, in
incompletely visualized on this exam, measuring 9 x 14 mm. The
length of the hematoma is not apparent. Postsurgical air is noted in
the soft tissues of the thigh.

Soft tissues

Soft tissue hematoma in the lateral aspect of the thigh as
described.
IMPRESSION: 1. Status post open reduction and internal fixation of the fracture
of the right acetabulum.
2. New impaction fracture of the lateral aspect of the femoral head.
3. New subcutaneous hematoma superficial to the vastus lateralis
muscle in the lateral aspect of the right thigh.

## 2020-03-28 IMAGING — DX DG ANKLE COMPLETE 3+V*L*
3 series · 3 of 3 positions shown · non-contrast
Comparison: Portable exam 6693 hours compared to earlier
intraoperative images of same date

CLINICAL DATA: Post ORIF LEFT ankle

EXAM:
LEFT ANKLE COMPLETE - 3+ VIEW

[ankle ap]
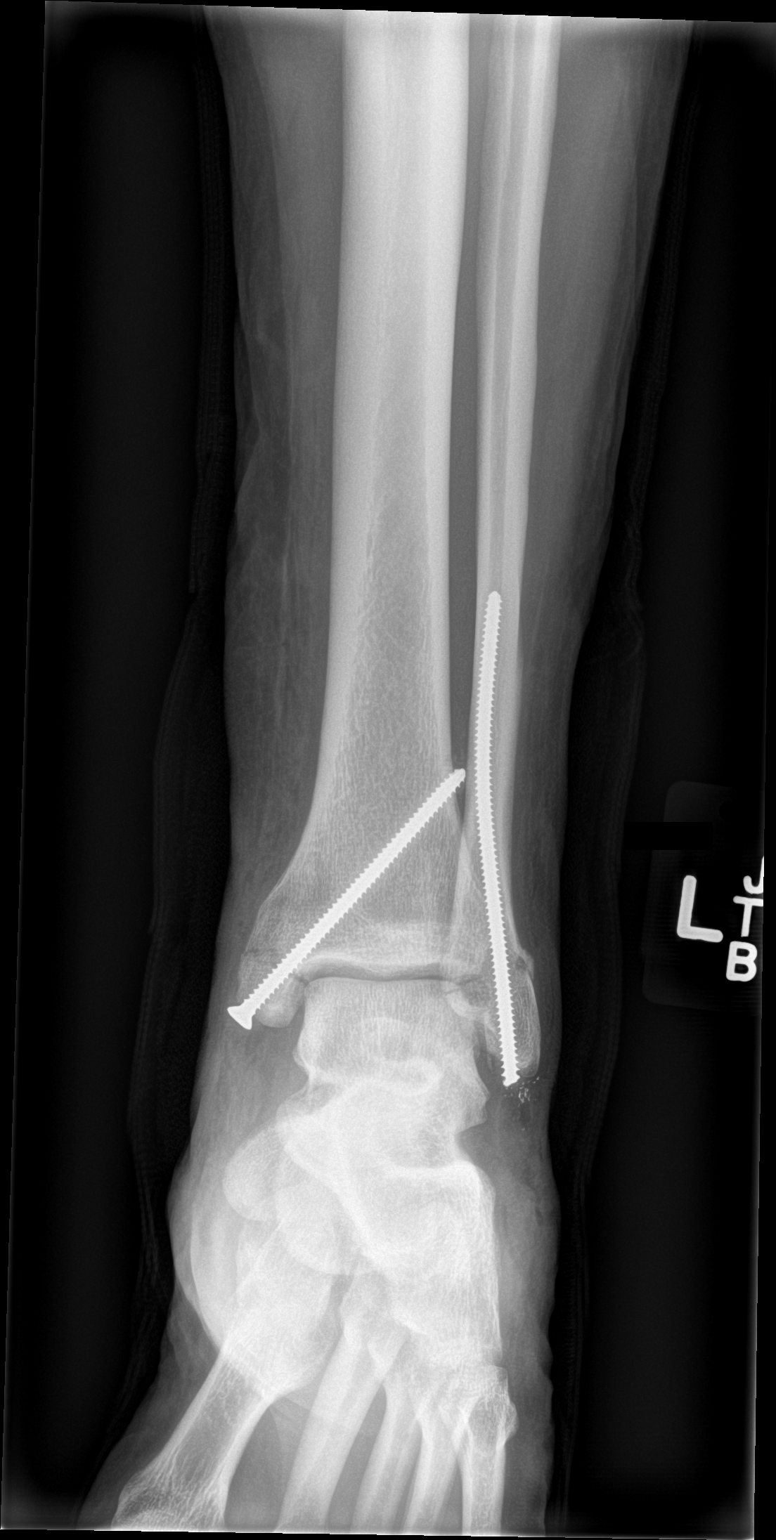

[ankle obl]
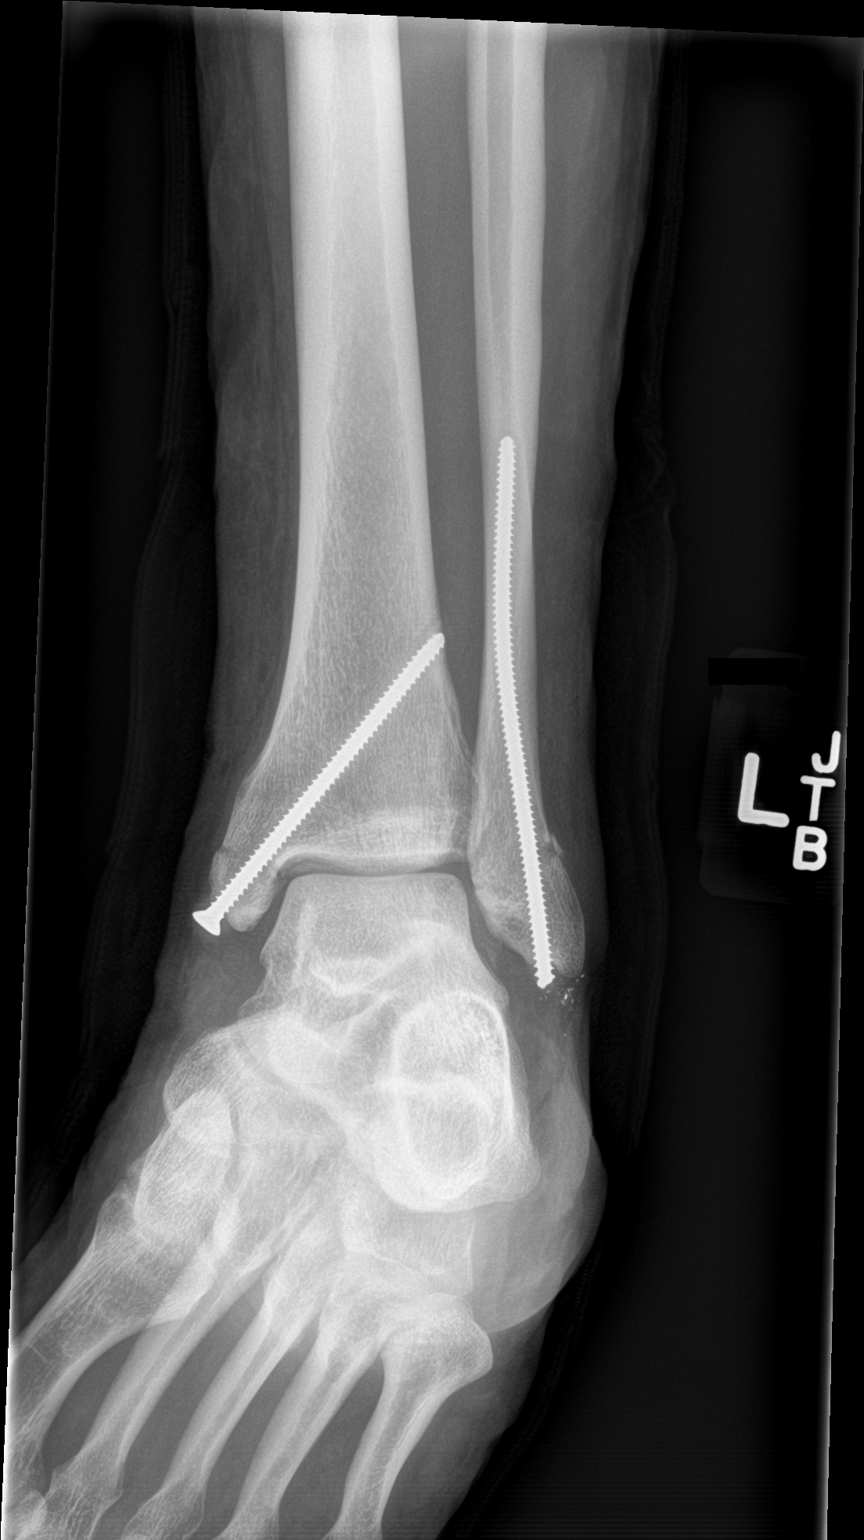

[ankle lat]
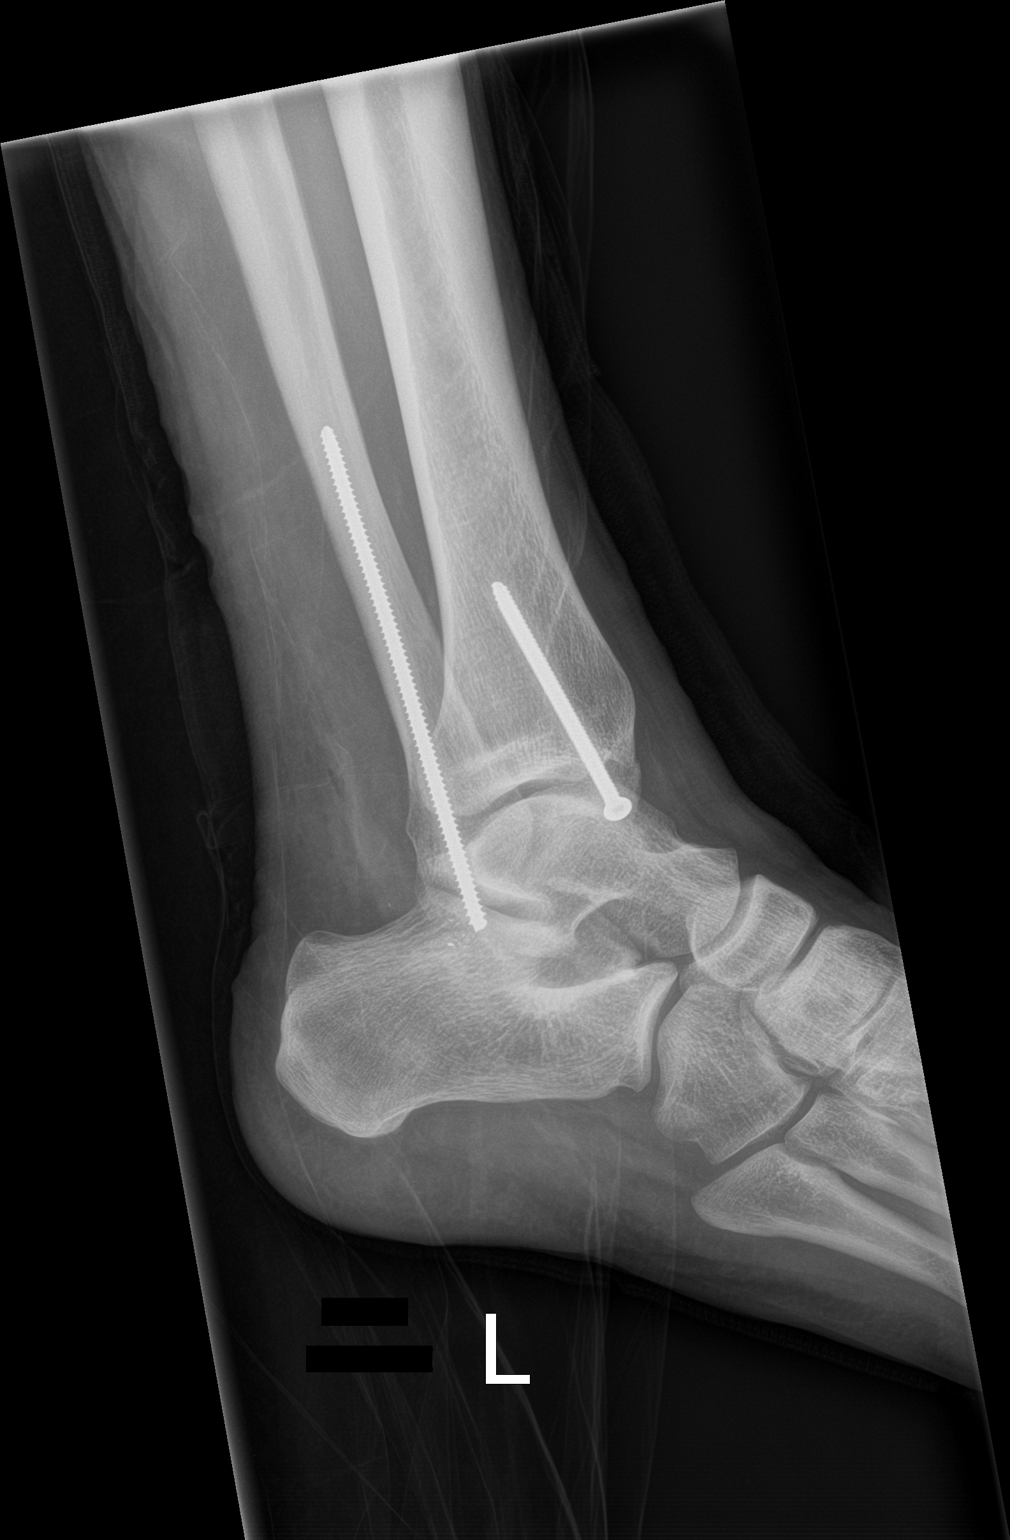

[3 of 3 positions shown; findings below may reference images not displayed]

FINDINGS: Screws are present at the distal tibia and distal fibula post ORIF
of bimalleolar fractures.

Ankle mortise intact.

Head of the distal fibular screw is missing and small amount of
metallic debris is seen inferior to the tip of the lateral
malleolus.

No additional fracture, dislocation, or bone destruction.
IMPRESSION: Post ORIF of bimalleolar fractures.
